# Patient Record
Sex: Female | Born: 1955
Health system: Southern US, Community
[De-identification: ages and names within clinical notes are randomized; demographics above are authoritative.]

## PROBLEM LIST (undated history)

## (undated) DIAGNOSIS — N200 Calculus of kidney: Secondary | ICD-10-CM

## (undated) DIAGNOSIS — B019 Varicella without complication: Secondary | ICD-10-CM

## (undated) DIAGNOSIS — A630 Anogenital (venereal) warts: Secondary | ICD-10-CM

## (undated) DIAGNOSIS — R519 Headache, unspecified: Secondary | ICD-10-CM

## (undated) DIAGNOSIS — M1990 Unspecified osteoarthritis, unspecified site: Secondary | ICD-10-CM

## (undated) DIAGNOSIS — K602 Anal fissure, unspecified: Secondary | ICD-10-CM

## (undated) DIAGNOSIS — E785 Hyperlipidemia, unspecified: Secondary | ICD-10-CM

## (undated) DIAGNOSIS — E669 Obesity, unspecified: Secondary | ICD-10-CM

## (undated) DIAGNOSIS — R51 Headache: Secondary | ICD-10-CM

## (undated) DIAGNOSIS — H9209 Otalgia, unspecified ear: Secondary | ICD-10-CM

## (undated) DIAGNOSIS — E7800 Pure hypercholesterolemia, unspecified: Secondary | ICD-10-CM

## (undated) DIAGNOSIS — T7840XA Allergy, unspecified, initial encounter: Secondary | ICD-10-CM

## (undated) DIAGNOSIS — G5600 Carpal tunnel syndrome, unspecified upper limb: Secondary | ICD-10-CM

## (undated) HISTORY — PX: COLONOSCOPY: SHX174

## (undated) HISTORY — DX: Headache, unspecified: R51.9

## (undated) HISTORY — DX: Anogenital (venereal) warts: A63.0

## (undated) HISTORY — DX: Allergy, unspecified, initial encounter: T78.40XA

## (undated) HISTORY — DX: Carpal tunnel syndrome, unspecified upper limb: G56.00

## (undated) HISTORY — DX: Obesity, unspecified: E66.9

## (undated) HISTORY — DX: Varicella without complication: B01.9

## (undated) HISTORY — DX: Anal fissure, unspecified: K60.2

## (undated) HISTORY — DX: Hyperlipidemia, unspecified: E78.5

## (undated) HISTORY — PX: FACIAL COSMETIC SURGERY: SHX629

## (undated) HISTORY — DX: Unspecified osteoarthritis, unspecified site: M19.90

## (undated) HISTORY — PX: CERVICAL FUSION: SHX112

## (undated) HISTORY — DX: Headache: R51

---

## 1998-08-17 ENCOUNTER — Other Ambulatory Visit: Admission: RE | Admit: 1998-08-17 | Discharge: 1998-08-17 | Payer: Self-pay | Admitting: Obstetrics and Gynecology

## 1999-04-18 ENCOUNTER — Encounter: Admission: RE | Admit: 1999-04-18 | Discharge: 1999-04-18 | Payer: Self-pay | Admitting: Obstetrics and Gynecology

## 1999-04-18 ENCOUNTER — Encounter: Payer: Self-pay | Admitting: Obstetrics and Gynecology

## 1999-08-24 ENCOUNTER — Ambulatory Visit (HOSPITAL_COMMUNITY): Admission: RE | Admit: 1999-08-24 | Discharge: 1999-08-24 | Payer: Self-pay | Admitting: Obstetrics and Gynecology

## 1999-10-03 ENCOUNTER — Other Ambulatory Visit: Admission: RE | Admit: 1999-10-03 | Discharge: 1999-10-03 | Payer: Self-pay | Admitting: Obstetrics and Gynecology

## 2000-09-25 ENCOUNTER — Other Ambulatory Visit: Admission: RE | Admit: 2000-09-25 | Discharge: 2000-09-25 | Payer: Self-pay | Admitting: Obstetrics and Gynecology

## 2000-10-15 ENCOUNTER — Encounter: Payer: Self-pay | Admitting: Obstetrics and Gynecology

## 2000-10-15 ENCOUNTER — Encounter: Admission: RE | Admit: 2000-10-15 | Discharge: 2000-10-15 | Payer: Self-pay | Admitting: Obstetrics and Gynecology

## 2001-10-16 ENCOUNTER — Encounter: Payer: Self-pay | Admitting: Obstetrics and Gynecology

## 2001-10-16 ENCOUNTER — Encounter: Admission: RE | Admit: 2001-10-16 | Discharge: 2001-10-16 | Payer: Self-pay | Admitting: Obstetrics and Gynecology

## 2002-04-01 ENCOUNTER — Emergency Department (HOSPITAL_COMMUNITY): Admission: EM | Admit: 2002-04-01 | Discharge: 2002-04-02 | Payer: Self-pay | Admitting: Emergency Medicine

## 2002-04-02 ENCOUNTER — Encounter: Payer: Self-pay | Admitting: Emergency Medicine

## 2002-04-04 ENCOUNTER — Emergency Department (HOSPITAL_COMMUNITY): Admission: EM | Admit: 2002-04-04 | Discharge: 2002-04-04 | Payer: Self-pay | Admitting: Emergency Medicine

## 2002-10-22 ENCOUNTER — Encounter: Admission: RE | Admit: 2002-10-22 | Discharge: 2002-10-22 | Payer: Self-pay | Admitting: Obstetrics and Gynecology

## 2002-10-22 ENCOUNTER — Encounter: Payer: Self-pay | Admitting: Obstetrics and Gynecology

## 2003-11-04 ENCOUNTER — Encounter: Admission: RE | Admit: 2003-11-04 | Discharge: 2003-11-04 | Payer: Self-pay | Admitting: Obstetrics and Gynecology

## 2004-11-23 ENCOUNTER — Encounter: Admission: RE | Admit: 2004-11-23 | Discharge: 2004-11-23 | Payer: Self-pay | Admitting: Obstetrics and Gynecology

## 2006-03-24 ENCOUNTER — Encounter: Admission: RE | Admit: 2006-03-24 | Discharge: 2006-03-24 | Payer: Self-pay | Admitting: Obstetrics and Gynecology

## 2007-02-04 ENCOUNTER — Emergency Department (HOSPITAL_COMMUNITY): Admission: EM | Admit: 2007-02-04 | Discharge: 2007-02-04 | Payer: Self-pay | Admitting: Emergency Medicine

## 2007-05-15 ENCOUNTER — Encounter: Admission: RE | Admit: 2007-05-15 | Discharge: 2007-05-15 | Payer: Self-pay | Admitting: Obstetrics and Gynecology

## 2008-06-24 ENCOUNTER — Encounter: Admission: RE | Admit: 2008-06-24 | Discharge: 2008-06-24 | Payer: Self-pay | Admitting: Obstetrics and Gynecology

## 2009-09-29 ENCOUNTER — Encounter: Admission: RE | Admit: 2009-09-29 | Discharge: 2009-09-29 | Payer: Self-pay | Admitting: Obstetrics and Gynecology

## 2010-07-13 NOTE — Op Note (Signed)
[facility]  Patient:    Lauren Howard, Lauren Howard                    MRN: [id number] Proc. Date: 08/24/99 Adm. Date:  [id number] Disc. Date: [id number] Attending:  Malon Kindle                           Operative Report  PREOPERATIVE DIAGNOSIS:  Voluntary sterilization.  POSTOPERATIVE DIAGNOSIS:  Voluntary sterilization.  OPERATION PERFORMED:  Laparoscopic tubal cauterization.  SURGEON:  Malachi Pro. Ambrose Mantle, M.D.  ANESTHESIA:  General.  DESCRIPTION OF PROCEDURE:  The patient was brought to the operating room and placed under satisfactory general anesthesia.  She was then placed in the knee stirrups.  The abdomen, vulva, vagina and urethra were prepped with Betadine solution.  The bladder was emptied with a [place] catheter.  A Hulka cannula was placed into the uterus and attached to the anterior cervical lip.  The abdomen was then draped as a sterile field.  A small incision was made in the inferior portion of the umbilicus.  A Veress cannula was placed into the abdominal cavity.  Aspiration revealed it was not in a blood vessel, not in bowel or urinary contents.  Elevating the abdominal wall gave a negative pressure.  2.5L of CO2 were then injected with filling pressure of ____________ mmHg.  The Veress cannula removed.  The incision was enlarged. The laparoscopic trocar was inserted followed by the laparoscope with good visualization of the pelvic contents.  The uterus was normal size without abnormality.  Both cul-de-sacs were normal.  Both tubes and ovaries appeared completely normal.  The midportion of each tube was grasped with Kleppinger forceps using a bipolar current of 4.  I continued cauterization of each tube, three or four consecutive locations from the midportion of the tube toward the uterine cornu until all the vapor had been removed and the meter read 0 and good blanching occurred.  There were no complications.  The upper  abdomen appeared normal.  The liver was smooth, the bowel appeared normal.  I did not see the gallbladder.  The lower abdominal incision was inspected for bleeding. There was no bleeding.  CO2 was allowed to escape from the peritoneal cavity. I removed the laparoscopic trocar and then very gradually removed the laparoscope so that I could inspect the umbilical incision for bleeding and there was no bleeding.  I closed the umbilical incision with several interrupted sutures of 3-0 plain catgut, closed the lower incision with Steri-Strips.  The patient seemed to tolerate the procedure well.  Blood loss was less than 5 cc.  The sponge and needle counts were correct.  The Hulka cannula was removed from the uterus and the patient was returned to recovery in satisfactory condition. DD:  09/19/99 TD:  09/21/99 Job: [id number]

## 2010-10-08 ENCOUNTER — Other Ambulatory Visit: Payer: Self-pay | Admitting: Obstetrics and Gynecology

## 2010-10-08 DIAGNOSIS — Z1231 Encounter for screening mammogram for malignant neoplasm of breast: Secondary | ICD-10-CM

## 2010-10-10 ENCOUNTER — Ambulatory Visit
Admission: RE | Admit: 2010-10-10 | Discharge: 2010-10-10 | Disposition: A | Discharge status: Home / Self Care | Payer: 59 | Source: Ambulatory Visit | Attending: Obstetrics and Gynecology | Admitting: Obstetrics and Gynecology

## 2010-10-10 DIAGNOSIS — Z1231 Encounter for screening mammogram for malignant neoplasm of breast: Secondary | ICD-10-CM

## 2011-10-22 ENCOUNTER — Other Ambulatory Visit: Payer: Self-pay | Admitting: Obstetrics and Gynecology

## 2011-10-22 DIAGNOSIS — Z1231 Encounter for screening mammogram for malignant neoplasm of breast: Secondary | ICD-10-CM

## 2011-10-25 ENCOUNTER — Ambulatory Visit
Admission: RE | Admit: 2011-10-25 | Discharge: 2011-10-25 | Disposition: A | Discharge status: Home / Self Care | Payer: 59 | Source: Ambulatory Visit | Attending: Obstetrics and Gynecology | Admitting: Obstetrics and Gynecology

## 2011-10-25 DIAGNOSIS — Z1231 Encounter for screening mammogram for malignant neoplasm of breast: Secondary | ICD-10-CM

## 2012-01-12 ENCOUNTER — Encounter (HOSPITAL_COMMUNITY): Payer: Self-pay | Admitting: Emergency Medicine

## 2012-01-12 ENCOUNTER — Emergency Department (HOSPITAL_COMMUNITY)
Admission: EM | Admit: 2012-01-12 | Discharge: 2012-01-12 | Disposition: A | Discharge status: Home / Self Care | Payer: Self-pay | Source: Home / Self Care | Attending: Emergency Medicine | Admitting: Emergency Medicine

## 2012-01-12 DIAGNOSIS — H109 Unspecified conjunctivitis: Secondary | ICD-10-CM

## 2012-01-12 MED ORDER — NAPROXEN 500 MG PO TABS: 500.0000 mg | ORAL_TABLET | Freq: Two times a day (BID) | ORAL | Status: DC

## 2012-01-12 MED ORDER — CIPROFLOXACIN HCL 0.3 % OP SOLN: OPHTHALMIC | Status: DC

## 2012-01-12 MED ORDER — TETRACAINE HCL 0.5 % OP SOLN
OPHTHALMIC | Status: AC
  Filled 2012-01-12: qty 2

## 2012-01-12 NOTE — ED Notes (Signed)
Reports left eye pain which started last night.  Reports itching.  Patient says she wears daily contacts only in the left eye.  She did not wear any today.  Reports taking contact out at night.  Denies drainage from eye but does have yellowish mucous in corner of eye.  Eye is red.  Painful when eye is rubbing against lid

## 2012-01-12 NOTE — ED Provider Notes (Signed)
Chief Complaint  Patient presents with  . Eye Pain    History of Present Illness:   Lauren Howard is a 56 year old female who has had a one-day history of left eye pain. She describes a foreign body sensation and some redness and itching. There's been a little bit of mucoid drainage. She denies any changes in her vision. She does not think she got anything in the eye. She does wear contacts in that eye but has not had any trouble with the contact. She has left it out tonight. She sees Dr. Hazle Quant as her eye doctor. She denies any diplopia or visual field defects or flashing lights.  Review of Systems:  Other than noted above, the patient denies any of the following symptoms: Systemic:  No fever, chills, sweats, fatigue, or weight loss. Eye:  No redness, eye pain, photophobia, discharge, blurred vision, or diplopia. ENT:  No nasal congestion, rhinorrhea, or sore throat. Lymphatic:  No adenopathy. Skin:  No rash or pruritis.  PMFSH:  Past medical history, family history, social history, meds, and allergies were reviewed.  Physical Exam:   Vital signs:  BP 111/74  Pulse 83  Temp 97.7 F (36.5 C) (Oral)  Resp 16  SpO2 100% General:  Alert and in no distress. Eye:  Lids and periorbital tissues are normal. There was slight injection of the conjunctiva but no discharge or drainage. No foreign body was seen in the conjunctival sac. Cornea was intact to fluorescein staining. Anterior chamber was normal. Funduscopic exam was normal. PERRLA, full EOMs. Intraocular pressure was 14 with a Tono-Pen with 5% reliability. Near vision was 20/40 with both eyes, 20/40 with the left eye alone, and 20/50 with the right eye alone. ENT:  TMs and canals clear.  Nasal mucosa normal.  No intra-oral lesions, mucous membranes moist, pharynx clear. Neck:  No adenopathy tenderness or mass. Skin:  Clear, warm and dry.  Assessment:  The encounter diagnosis was Conjunctivitis.  Plan:   1.  The following meds were prescribed:     New Prescriptions   CIPROFLOXACIN (CILOXAN) 0.3 % OPHTHALMIC SOLUTION    1 drop in left eye every 3 hours while awake   NAPROXEN (NAPROSYN) 500 MG TABLET    Take 1 tablet (500 mg total) by mouth 2 (two) times daily.   2.  The patient was instructed in symptomatic care and handouts were given. I suggested she see Dr. Hazle Quant if she's not any better in 1-2 days. 3.  The patient was told to return if becoming worse in any way, if no better in 3 or 4 days, and given some red flag symptoms that would indicate earlier return.     Reuben Likes, MD 01/12/12 2053

## 2012-07-24 DIAGNOSIS — D1720 Benign lipomatous neoplasm of skin and subcutaneous tissue of unspecified limb: Secondary | ICD-10-CM | POA: Insufficient documentation

## 2012-07-24 DIAGNOSIS — N941 Unspecified dyspareunia: Secondary | ICD-10-CM | POA: Insufficient documentation

## 2012-07-24 DIAGNOSIS — M654 Radial styloid tenosynovitis [de Quervain]: Secondary | ICD-10-CM | POA: Insufficient documentation

## 2012-07-24 DIAGNOSIS — N959 Unspecified menopausal and perimenopausal disorder: Secondary | ICD-10-CM | POA: Insufficient documentation

## 2012-07-24 DIAGNOSIS — E7800 Pure hypercholesterolemia, unspecified: Secondary | ICD-10-CM | POA: Insufficient documentation

## 2012-09-04 LAB — HM COLONOSCOPY

## 2012-11-27 ENCOUNTER — Other Ambulatory Visit: Payer: Self-pay

## 2012-11-27 DIAGNOSIS — Z1231 Encounter for screening mammogram for malignant neoplasm of breast: Secondary | ICD-10-CM

## 2012-12-23 ENCOUNTER — Ambulatory Visit
Admission: RE | Admit: 2012-12-23 | Discharge: 2012-12-23 | Disposition: A | Discharge status: Home / Self Care | Payer: PRIVATE HEALTH INSURANCE | Source: Ambulatory Visit

## 2012-12-23 DIAGNOSIS — Z1231 Encounter for screening mammogram for malignant neoplasm of breast: Secondary | ICD-10-CM

## 2013-10-05 DIAGNOSIS — M545 Low back pain, unspecified: Secondary | ICD-10-CM | POA: Insufficient documentation

## 2013-12-29 ENCOUNTER — Other Ambulatory Visit: Payer: Self-pay

## 2014-01-24 ENCOUNTER — Other Ambulatory Visit: Payer: Self-pay

## 2014-01-24 DIAGNOSIS — Z1231 Encounter for screening mammogram for malignant neoplasm of breast: Secondary | ICD-10-CM

## 2014-02-15 ENCOUNTER — Ambulatory Visit
Admission: RE | Admit: 2014-02-15 | Discharge: 2014-02-15 | Disposition: A | Discharge status: Home / Self Care | Payer: PRIVATE HEALTH INSURANCE | Source: Ambulatory Visit

## 2014-02-15 DIAGNOSIS — Z1231 Encounter for screening mammogram for malignant neoplasm of breast: Secondary | ICD-10-CM

## 2014-10-13 ENCOUNTER — Encounter (HOSPITAL_COMMUNITY): Payer: Self-pay | Admitting: Emergency Medicine

## 2014-10-13 ENCOUNTER — Emergency Department (HOSPITAL_COMMUNITY)
Admission: EM | Admit: 2014-10-13 | Discharge: 2014-10-13 | Disposition: A | Discharge status: Home / Self Care | Payer: No Typology Code available for payment source | Attending: Emergency Medicine | Admitting: Emergency Medicine

## 2014-10-13 ENCOUNTER — Emergency Department (HOSPITAL_COMMUNITY): Payer: No Typology Code available for payment source

## 2014-10-13 DIAGNOSIS — Y92410 Unspecified street and highway as the place of occurrence of the external cause: Secondary | ICD-10-CM | POA: Diagnosis not present

## 2014-10-13 DIAGNOSIS — Z8669 Personal history of other diseases of the nervous system and sense organs: Secondary | ICD-10-CM | POA: Diagnosis not present

## 2014-10-13 DIAGNOSIS — S199XXA Unspecified injury of neck, initial encounter: Secondary | ICD-10-CM | POA: Insufficient documentation

## 2014-10-13 DIAGNOSIS — S3992XA Unspecified injury of lower back, initial encounter: Secondary | ICD-10-CM | POA: Insufficient documentation

## 2014-10-13 DIAGNOSIS — Z87442 Personal history of urinary calculi: Secondary | ICD-10-CM | POA: Insufficient documentation

## 2014-10-13 DIAGNOSIS — Y998 Other external cause status: Secondary | ICD-10-CM | POA: Diagnosis not present

## 2014-10-13 DIAGNOSIS — Z8639 Personal history of other endocrine, nutritional and metabolic disease: Secondary | ICD-10-CM | POA: Diagnosis not present

## 2014-10-13 DIAGNOSIS — Y9389 Activity, other specified: Secondary | ICD-10-CM | POA: Diagnosis not present

## 2014-10-13 DIAGNOSIS — Z79899 Other long term (current) drug therapy: Secondary | ICD-10-CM | POA: Diagnosis not present

## 2014-10-13 DIAGNOSIS — S24109A Unspecified injury at unspecified level of thoracic spinal cord, initial encounter: Secondary | ICD-10-CM | POA: Diagnosis not present

## 2014-10-13 DIAGNOSIS — Z880 Allergy status to penicillin: Secondary | ICD-10-CM | POA: Insufficient documentation

## 2014-10-13 HISTORY — DX: Pure hypercholesterolemia, unspecified: E78.00

## 2014-10-13 HISTORY — DX: Calculus of kidney: N20.0

## 2014-10-13 HISTORY — DX: Otalgia, unspecified ear: H92.09

## 2014-10-13 MED ORDER — DIAZEPAM 2 MG PO TABS
2.0000 mg | ORAL_TABLET | Freq: Once | ORAL | Status: AC
  Administered 2014-10-13: 2 mg via ORAL
  Filled 2014-10-13: qty 1

## 2014-10-13 MED ORDER — NAPROXEN 500 MG PO TABS: 500.0000 mg | ORAL_TABLET | Freq: Two times a day (BID) | ORAL | Status: DC

## 2014-10-13 MED ORDER — DIAZEPAM 2 MG PO TABS: 2.0000 mg | ORAL_TABLET | Freq: Three times a day (TID) | ORAL | Status: DC

## 2014-10-13 MED ORDER — KETOROLAC TROMETHAMINE 30 MG/ML IJ SOLN
30.0000 mg | Freq: Once | INTRAMUSCULAR | Status: AC
  Administered 2014-10-13: 30 mg via INTRAMUSCULAR
  Filled 2014-10-13: qty 1

## 2014-10-13 NOTE — ED Notes (Signed)
Applied ice to pt's lower back
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
## 2014-10-17 ENCOUNTER — Encounter (HOSPITAL_COMMUNITY): Payer: Self-pay | Admitting: Vascular Surgery

## 2014-10-17 ENCOUNTER — Emergency Department (HOSPITAL_COMMUNITY)
Admission: EM | Admit: 2014-10-17 | Discharge: 2014-10-17 | Disposition: A | Discharge status: Home / Self Care | Payer: PRIVATE HEALTH INSURANCE | Attending: Emergency Medicine | Admitting: Emergency Medicine

## 2014-10-17 DIAGNOSIS — S4992XD Unspecified injury of left shoulder and upper arm, subsequent encounter: Secondary | ICD-10-CM | POA: Diagnosis not present

## 2014-10-17 DIAGNOSIS — S0990XD Unspecified injury of head, subsequent encounter: Secondary | ICD-10-CM | POA: Diagnosis not present

## 2014-10-17 DIAGNOSIS — Z79899 Other long term (current) drug therapy: Secondary | ICD-10-CM | POA: Diagnosis not present

## 2014-10-17 DIAGNOSIS — Z8639 Personal history of other endocrine, nutritional and metabolic disease: Secondary | ICD-10-CM | POA: Diagnosis not present

## 2014-10-17 DIAGNOSIS — S3992XD Unspecified injury of lower back, subsequent encounter: Secondary | ICD-10-CM | POA: Diagnosis present

## 2014-10-17 DIAGNOSIS — T148XXA Other injury of unspecified body region, initial encounter: Secondary | ICD-10-CM

## 2014-10-17 DIAGNOSIS — S199XXD Unspecified injury of neck, subsequent encounter: Secondary | ICD-10-CM | POA: Diagnosis not present

## 2014-10-17 DIAGNOSIS — Z880 Allergy status to penicillin: Secondary | ICD-10-CM | POA: Diagnosis not present

## 2014-10-17 DIAGNOSIS — S3991XD Unspecified injury of abdomen, subsequent encounter: Secondary | ICD-10-CM | POA: Diagnosis not present

## 2014-10-17 DIAGNOSIS — Z87442 Personal history of urinary calculi: Secondary | ICD-10-CM | POA: Diagnosis not present

## 2014-10-17 DIAGNOSIS — Z791 Long term (current) use of non-steroidal anti-inflammatories (NSAID): Secondary | ICD-10-CM | POA: Insufficient documentation

## 2014-10-17 LAB — CBC
HCT: 38.9 % (ref 36.0–46.0)
Hemoglobin: 13 g/dL (ref 12.0–15.0)
MCH: 30.2 pg (ref 26.0–34.0)
MCHC: 33.4 g/dL (ref 30.0–36.0)
MCV: 90.5 fL (ref 78.0–100.0)
Platelets: 266 10*3/uL (ref 150–400)
RBC: 4.3 MIL/uL (ref 3.87–5.11)
RDW: 12.9 % (ref 11.5–15.5)
WBC: 7 10*3/uL (ref 4.0–10.5)

## 2014-10-17 LAB — BASIC METABOLIC PANEL
Anion gap: 6 (ref 5–15)
BUN: 11 mg/dL (ref 6–20)
CO2: 28 mmol/L (ref 22–32)
Calcium: 9.4 mg/dL (ref 8.9–10.3)
Chloride: 106 mmol/L (ref 101–111)
Creatinine, Ser: 0.63 mg/dL (ref 0.44–1.00)
GFR calc Af Amer: >60 mL/min (ref >60)
GFR calc non Af Amer: >60 mL/min (ref >60)
Glucose, Bld: 88 mg/dL (ref 65–99)
Potassium: 4 mmol/L (ref 3.5–5.1)
Sodium: 140 mmol/L (ref 135–145)

## 2014-10-17 LAB — URINALYSIS, ROUTINE W REFLEX MICROSCOPIC
Bilirubin Urine: NEGATIVE
Glucose, UA: NEGATIVE mg/dL
Hgb urine dipstick: NEGATIVE
Ketones, ur: NEGATIVE mg/dL
Leukocytes, UA: NEGATIVE
Nitrite: NEGATIVE
Protein, ur: NEGATIVE mg/dL
Specific Gravity, Urine: 1.008 (ref 1.005–1.030)
Urobilinogen, UA: 0.2 mg/dL (ref 0.0–1.0)
pH: 7 (ref 5.0–8.0)

## 2014-10-17 MED ORDER — TRAMADOL HCL 50 MG PO TABS
50.0000 mg | ORAL_TABLET | Freq: Once | ORAL | Status: AC
  Administered 2014-10-17: 50 mg via ORAL
  Filled 2014-10-17: qty 1

## 2014-10-17 MED ORDER — NAPROXEN 500 MG PO TABS: 500.0000 mg | ORAL_TABLET | Freq: Two times a day (BID) | ORAL | Status: DC

## 2014-10-17 MED ORDER — DIAZEPAM 5 MG PO TABS
5.0000 mg | ORAL_TABLET | Freq: Once | ORAL | Status: AC
  Administered 2014-10-17: 5 mg via ORAL
  Filled 2014-10-17: qty 1

## 2014-10-17 MED ORDER — IBUPROFEN 400 MG PO TABS
600.0000 mg | ORAL_TABLET | Freq: Once | ORAL | Status: AC
  Administered 2014-10-17: 600 mg via ORAL
  Filled 2014-10-17: qty 2

## 2014-10-17 MED ORDER — TRAMADOL HCL 50 MG PO TABS: 50.0000 mg | ORAL_TABLET | Freq: Four times a day (QID) | ORAL | Status: DC | PRN

## 2014-10-17 MED ORDER — DIAZEPAM 2 MG PO TABS: 2.0000 mg | ORAL_TABLET | Freq: Two times a day (BID) | ORAL | Status: DC
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
## 2014-10-17 NOTE — ED Notes (Signed)
PT offered a warm blanket but refused.
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
## 2014-10-17 NOTE — ED Notes (Signed)
Declined W/C at D/C and was escorted to lobby by RN. 

## 2015-02-03 ENCOUNTER — Other Ambulatory Visit: Payer: Self-pay

## 2015-02-03 DIAGNOSIS — Z1231 Encounter for screening mammogram for malignant neoplasm of breast: Secondary | ICD-10-CM

## 2015-03-06 ENCOUNTER — Ambulatory Visit: Payer: PRIVATE HEALTH INSURANCE

## 2015-03-10 ENCOUNTER — Ambulatory Visit
Admission: RE | Admit: 2015-03-10 | Discharge: 2015-03-10 | Disposition: A | Discharge status: Home / Self Care | Payer: PRIVATE HEALTH INSURANCE | Source: Ambulatory Visit

## 2015-03-10 DIAGNOSIS — Z1231 Encounter for screening mammogram for malignant neoplasm of breast: Secondary | ICD-10-CM

## 2016-02-09 ENCOUNTER — Other Ambulatory Visit: Payer: Self-pay | Admitting: Family Medicine

## 2016-02-09 DIAGNOSIS — E559 Vitamin D deficiency, unspecified: Secondary | ICD-10-CM

## 2016-02-26 HISTORY — PX: HAND SURGERY: SHX662

## 2016-03-19 ENCOUNTER — Ambulatory Visit
Admission: RE | Admit: 2016-03-19 | Discharge: 2016-03-19 | Disposition: A | Discharge status: Home / Self Care | Payer: PRIVATE HEALTH INSURANCE | Source: Ambulatory Visit | Attending: Family Medicine | Admitting: Family Medicine

## 2016-03-19 DIAGNOSIS — E559 Vitamin D deficiency, unspecified: Secondary | ICD-10-CM

## 2017-01-20 DIAGNOSIS — M79644 Pain in right finger(s): Secondary | ICD-10-CM | POA: Insufficient documentation

## 2017-01-20 DIAGNOSIS — M25642 Stiffness of left hand, not elsewhere classified: Secondary | ICD-10-CM | POA: Insufficient documentation

## 2017-02-28 ENCOUNTER — Other Ambulatory Visit: Payer: Self-pay | Admitting: Family Medicine

## 2017-02-28 DIAGNOSIS — Z1231 Encounter for screening mammogram for malignant neoplasm of breast: Secondary | ICD-10-CM

## 2017-03-21 ENCOUNTER — Ambulatory Visit
Admission: RE | Admit: 2017-03-21 | Discharge: 2017-03-21 | Disposition: A | Discharge status: Home / Self Care | Payer: 59 | Source: Ambulatory Visit | Attending: Family Medicine | Admitting: Family Medicine

## 2017-03-21 DIAGNOSIS — Z1231 Encounter for screening mammogram for malignant neoplasm of breast: Secondary | ICD-10-CM | POA: Diagnosis not present

## 2017-03-25 DIAGNOSIS — M25632 Stiffness of left wrist, not elsewhere classified: Secondary | ICD-10-CM | POA: Diagnosis not present

## 2017-03-25 DIAGNOSIS — M25532 Pain in left wrist: Secondary | ICD-10-CM | POA: Diagnosis not present

## 2017-03-25 DIAGNOSIS — M25642 Stiffness of left hand, not elsewhere classified: Secondary | ICD-10-CM | POA: Diagnosis not present

## 2017-04-02 ENCOUNTER — Ambulatory Visit (HOSPITAL_COMMUNITY)
Admission: EM | Admit: 2017-04-02 | Discharge: 2017-04-02 | Disposition: A | Discharge status: Home / Self Care | Payer: 59 | Attending: Emergency Medicine | Admitting: Emergency Medicine

## 2017-04-02 ENCOUNTER — Encounter (HOSPITAL_COMMUNITY): Payer: Self-pay | Admitting: *Deleted

## 2017-04-02 DIAGNOSIS — H9202 Otalgia, left ear: Secondary | ICD-10-CM | POA: Diagnosis not present

## 2017-04-02 DIAGNOSIS — J111 Influenza due to unidentified influenza virus with other respiratory manifestations: Secondary | ICD-10-CM

## 2017-04-02 MED ORDER — HYDROCOD POLST-CPM POLST ER 10-8 MG/5ML PO SUER: 5.0000 mL | Freq: Two times a day (BID) | ORAL | 0 refills | Status: DC | PRN

## 2017-04-02 MED ORDER — FLUTICASONE PROPIONATE 50 MCG/ACT NA SUSP: 2.0000 | Freq: Every day | NASAL | 0 refills | Status: DC

## 2017-04-02 MED ORDER — IBUPROFEN 600 MG PO TABS: 600.0000 mg | ORAL_TABLET | Freq: Four times a day (QID) | ORAL | 0 refills | Status: DC | PRN

## 2017-04-02 MED ORDER — BENZONATATE 200 MG PO CAPS: 200.0000 mg | ORAL_CAPSULE | Freq: Three times a day (TID) | ORAL | 0 refills | Status: DC | PRN

## 2017-04-02 MED ORDER — OSELTAMIVIR PHOSPHATE 75 MG PO CAPS: 75.0000 mg | ORAL_CAPSULE | Freq: Two times a day (BID) | ORAL | 0 refills | Status: DC

## 2017-04-02 NOTE — ED Triage Notes (Signed)
Patient reports cough and fever since Monday. States cough is worse at night. Patient has been taking otc meds to help with fever. Patient also reports left sided earache, states that she used some numbing drops to help relieve pain.

## 2017-04-02 NOTE — Discharge Instructions (Signed)
Tamiflu, Flonase, Mucinex D, ibuprofen 600 mg to take with 1 g of Tylenol 3-4 times a day as needed for pain, fevers, Tussionex for the cough at night and Tessalon for cough during the day

## 2017-04-02 NOTE — ED Provider Notes (Signed)
HPI  SUBJECTIVE:  Lauren Howard is a 62 y.o. female who presents with the acute onset of fevers 101.8, body aches, headaches, sore throat starting 2 days ago.  She reports decreased appetite, nasal congestion, loose stools.  She reports chest soreness secondary to coughing.  No wheezing, chest pain, shortness of breath, dyspnea on exertion, rhinorrhea, postnasal drip, sinus pain or pressure.  No vomiting, abdominal pain, urinary complaints, neck stiffness, rash.  she reports a left earache starting yesterday.  Describes the pain is intermittent, throbbing, lasting several seconds and then resolving.  Is not associate with chewing, yawning.  She denies foreign body insertion, recent swimming.  No change in hearing, otorrhea.  She tried a numbing eardrop with improvement in her ear pain.  No aggravating factors.  She got a flu shot this year.  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  She is also tried cold and flu over-the-counter medicines, NyQuil, Tylenol temporary symptom improvement.  No aggravating factors.  No antibiotics in the past month.  No antipyretic in the past 6-8 hours.  Past medical history negative for otitis media, diabetes, hypertension, asthma, eczema, COPD, smoking.  She has had TMJ arthralgia.  PMD: Dr. Berlin Hun.     Past Medical History:  Diagnosis Date  . Ear pain   . Hypercholesterolemia   . Kidney stones     Past Surgical History:  Procedure Laterality Date  . FACIAL COSMETIC SURGERY      Family History  Problem Relation Age of Onset  . Hypertension Mother   . Diabetes Mother     Social History   Tobacco Use  . Smoking status: Never Smoker  . Smokeless tobacco: Never Used  Substance Use Topics  . Alcohol use: Yes    Comment: occassional wine  . Drug use: No    No current facility-administered medications for this encounter.   Current Outpatient Medications:  .  CALCIUM PO, Take 1 tablet by mouth daily., Disp:  , Rfl:  .  Multiple Vitamin (MULTIVITAMIN WITH MINERALS) TABS tablet, Take 1 tablet by mouth daily., Disp: , Rfl:  .  omega-3 acid ethyl esters (LOVAZA) 1 G capsule, Take 2 g by mouth daily., Disp: , Rfl:  .  benzonatate (TESSALON) 200 MG capsule, Take 1 capsule (200 mg total) by mouth 3 (three) times daily as needed for cough., Disp: 30 capsule, Rfl: 0 .  chlorpheniramine-HYDROcodone (TUSSIONEX PENNKINETIC ER) 10-8 MG/5ML SUER, Take 5 mLs by mouth every 12 (twelve) hours as needed for cough., Disp: 120 mL, Rfl: 0 .  fluticasone (FLONASE) 50 MCG/ACT nasal spray, Place 2 sprays into both nostrils daily., Disp: 16 g, Rfl: 0 .  ibuprofen (ADVIL,MOTRIN) 600 MG tablet, Take 1 tablet (600 mg total) by mouth every 6 (six) hours as needed., Disp: 30 tablet, Rfl: 0 .  oseltamivir (TAMIFLU) 75 MG capsule, Take 1 capsule (75 mg total) by mouth 2 (two) times daily. X 5 days, Disp: 10 capsule, Rfl: 0 .  Probiotic Product (PROBIOTIC PO), Take 1 tablet by mouth daily., Disp: , Rfl:   Allergies  Allergen Reactions  . Erythromycin     rash  . Penicillins     Rash, swelling  . Triple Antibiotic [Bacitracin-Neomycin-Polymyxin]     rash     ROS  As noted in HPI.   Physical Exam  BP 117/68 (BP Location: Right Arm)   Pulse 92   Temp 98.8 F (37.1 C) (Oral)   Resp 17  SpO2 99%   Constitutional: Well developed, well nourished, no acute distress Eyes: PERRL, EOMI, conjunctiva normal bilaterally HENT: Normocephalic, atraumatic,mucus membranes moist.  Left external ear canal normal.  No pain with traction on the pinna, palpation of tragus.  No tenderness over the mastoid.  Left external ear canal normal.  Left TM injected, retracted.  Sharp light reflex.  No tenderness, crepitus over the TMJ bilaterally clear nasal congestion, normal turbinates.  No sinus tenderness.  Normal oropharynx.  No obvious postnasal drip.  No cobblestoning. Neck: No cervical lymphadenopathy, meningismus Respiratory: Clear to  auscultation bilaterally, no rales, no wheezing, no rhonchi no chest wall tenderness Cardiovascular: Normal rate and rhythm, no murmurs, no gallops, no rubs GI: Soft, nondistended, normal bowel sounds, nontender, no rebound, no guarding Back: no CVAT skin: No rash, skin intact Musculoskeletal: No edema, no tenderness, no deformities Neurologic: Alert & oriented x 3, CN II-XII grossly intact, no motor deficits, sensation grossly intact Psychiatric: Speech and behavior appropriate   ED Course   Medications - No data to display  No orders of the defined types were placed in this encounter.  No results found for this or any previous visit (from the past 24 hour(s)). No results found.  ED Clinical Impression  Influenza  Left ear pain   ED Assessment/Plan   [place] Narcotic database reviewed for this patient, and feel that the risk/benefit ratio today is favorable for proceeding with a prescription for controlled substance.  No opiate prescriptions since November 2018.  Presentation consistent with influenza.  Feel that her otalgia is due to the nasal congestion.  It is not appear to be a classic otitis externa at this point in time.  Plan to send home with Tamiflu, Flonase, Mucinex D, ibuprofen 600 mg to take with 1 g of Tylenol 3-4 times a day as needed for pain, fevers, Tussionex and Tessalon.  Follow-up with primary care physician as needed.   Meds ordered this encounter  Medications  . fluticasone (FLONASE) 50 MCG/ACT nasal spray    Sig: Place 2 sprays into both nostrils daily.    Dispense:  16 g    Refill:  0  . ibuprofen (ADVIL,MOTRIN) 600 MG tablet    Sig: Take 1 tablet (600 mg total) by mouth every 6 (six) hours as needed.    Dispense:  30 tablet    Refill:  0  . benzonatate (TESSALON) 200 MG capsule    Sig: Take 1 capsule (200 mg total) by mouth 3 (three) times daily as needed for cough.    Dispense:  30 capsule    Refill:  0  . chlorpheniramine-HYDROcodone (TUSSIONEX  PENNKINETIC ER) 10-8 MG/5ML SUER    Sig: Take 5 mLs by mouth every 12 (twelve) hours as needed for cough.    Dispense:  120 mL    Refill:  0  . oseltamivir (TAMIFLU) 75 MG capsule    Sig: Take 1 capsule (75 mg total) by mouth 2 (two) times daily. X 5 days    Dispense:  10 capsule    Refill:  0    *This clinic note was created using [employer]. Therefore, there may be occasional mistakes despite careful proofreading.  ?   Melynda Ripple, MD 04/03/17 1402

## 2017-04-05 ENCOUNTER — Ambulatory Visit (HOSPITAL_COMMUNITY)
Admission: EM | Admit: 2017-04-05 | Discharge: 2017-04-05 | Disposition: A | Discharge status: Home / Self Care | Payer: 59 | Attending: Physician Assistant | Admitting: Physician Assistant

## 2017-04-05 ENCOUNTER — Encounter (HOSPITAL_COMMUNITY): Payer: Self-pay | Admitting: Emergency Medicine

## 2017-04-05 DIAGNOSIS — J069 Acute upper respiratory infection, unspecified: Secondary | ICD-10-CM

## 2017-04-05 DIAGNOSIS — B9789 Other viral agents as the cause of diseases classified elsewhere: Secondary | ICD-10-CM

## 2017-04-05 MED ORDER — ALBUTEROL SULFATE HFA 108 (90 BASE) MCG/ACT IN AERS: 2.0000 | INHALATION_SPRAY | Freq: Four times a day (QID) | RESPIRATORY_TRACT | 2 refills | Status: DC | PRN

## 2017-04-05 MED ORDER — PREDNISONE 5 MG (21) PO TBPK: ORAL_TABLET | ORAL | 0 refills | Status: DC

## 2017-04-05 NOTE — Discharge Instructions (Signed)
No signs of pneumonia which is great. Will add a prednisone pack for 6 days, and Albuterol inhaler. Use every 6 hours (while awake) for 2 days then as needed. I think you will improve. The cough may linger which is normal, but hopefully you will begin to slowly feel better. Continue to use the medications given by prior provider. Drink lots of water. Hope you feel better

## 2017-04-05 NOTE — ED Triage Notes (Signed)
PT C/O: dry cough .... Seen her eon 04/02/17 for flu like sx  DENIES: fevers  A&O x4... NAD... Ambulatory

## 2017-04-05 NOTE — ED Provider Notes (Signed)
[facility]    CSN: [id number] Arrival date & time: 04/05/17  1213     History   Chief Complaint Chief Complaint  Patient presents with  . Cough    HPI Lauren Howard is a 62 y.o. female.   Presents with continued cough. She was seen here on 02/06 and diagnosed with FLU. She continues to report a dry cough, fatigue and non-appetite. Her fevers have improved and she is feeling some better, but just tired and "drained". She feels that her chest is "burning" at times and is worried about pneumonia.  She has some mild sore throat. She has almost completed course of Tamiflu as well.       Past Medical History:  Diagnosis Date  . Ear pain   . Hypercholesterolemia   . Kidney stones     There are no active problems to display for this patient.   Past Surgical History:  Procedure Laterality Date  . FACIAL COSMETIC SURGERY      OB History    No data available       Home Medications    Prior to Admission medications   Medication Sig Start Date End Date Taking? Authorizing Provider  benzonatate (TESSALON) 200 MG capsule Take 1 capsule (200 mg total) by mouth 3 (three) times daily as needed for cough. 04/02/17  Yes Melynda Ripple, MD  CALCIUM PO Take 1 tablet by mouth daily.   Yes [provider]  chlorpheniramine-HYDROcodone (TUSSIONEX PENNKINETIC ER) 10-8 MG/5ML SUER Take 5 mLs by mouth every 12 (twelve) hours as needed for cough. 04/02/17  Yes Melynda Ripple, MD  fluticasone (FLONASE) 50 MCG/ACT nasal spray Place 2 sprays into both nostrils daily. 04/02/17  Yes Melynda Ripple, MD  ibuprofen (ADVIL,MOTRIN) 600 MG tablet Take 1 tablet (600 mg total) by mouth every 6 (six) hours as needed. 04/02/17  Yes Melynda Ripple, MD  Multiple Vitamin (MULTIVITAMIN WITH MINERALS) TABS tablet Take 1 tablet by mouth daily.   Yes [provider]  omega-3 acid ethyl esters (LOVAZA) 1 G capsule Take 2 g by mouth daily.   Yes [provider]    oseltamivir (TAMIFLU) 75 MG capsule Take 1 capsule (75 mg total) by mouth 2 (two) times daily. X 5 days 04/02/17  Yes Melynda Ripple, MD  Probiotic Product (PROBIOTIC PO) Take 1 tablet by mouth daily.   Yes [provider]  albuterol (PROVENTIL HFA;VENTOLIN HFA) 108 (90 Base) MCG/ACT inhaler Inhale 2 puffs into the lungs every 6 (six) hours as needed for wheezing or shortness of breath. 04/05/17   Bjorn Pippin, PA-C  predniSONE (STERAPRED UNI-PAK 21 TAB) 5 MG (21) TBPK tablet Take as directed 04/05/17   Bjorn Pippin, PA-C    Family History Family History  Problem Relation Age of Onset  . Hypertension Mother   . Diabetes Mother     Social History Social History   Tobacco Use  . Smoking status: Never Smoker  . Smokeless tobacco: Never Used  Substance Use Topics  . Alcohol use: Yes    Comment: occassional wine  . Drug use: No     Allergies   Erythromycin; Penicillins; and Triple antibiotic [bacitracin-neomycin-polymyxin]   Review of Systems Review of Systems  Constitutional: Positive for appetite change and fatigue. Negative for fever.  HENT: Positive for sore throat.   Eyes: Negative.   Respiratory: Positive for cough. Negative for shortness of breath and wheezing.   Musculoskeletal: Negative for myalgias.  Psychiatric/Behavioral: Negative.  Physical Exam Triage Vital Signs ED Triage Vitals  Enc Vitals Group     BP 04/05/17 1325 134/76     Pulse Rate 04/05/17 1325 72     Resp --      Temp 04/05/17 1325 (!) 97.5 F (36.4 C)     Temp Source 04/05/17 1325 Oral     SpO2 04/05/17 1325 98 %     Weight --      Height --      Head Circumference --      Peak Flow --      Pain Score 04/05/17 1326 5     Pain Loc --      Pain Edu? --      Excl. in [place]? --    No data found.  Updated Vital Signs BP 134/76 (BP Location: Left Arm)   Pulse 72   Temp (!) 97.5 F (36.4 C) (Oral)   SpO2 98%   Visual Acuity Right Eye Distance:   Left Eye  Distance:   Bilateral Distance:    Right Eye Near:   Left Eye Near:    Bilateral Near:     Physical Exam  Constitutional: She is oriented to person, place, and time. She appears well-developed and well-nourished. No distress.  HENT:  Head: Normocephalic and atraumatic.  Left Ear: External ear normal.  Nose: Nose normal.  Mouth/Throat: Oropharynx is clear and moist. No oropharyngeal exudate.  Neck: Normal range of motion.  Cardiovascular: Normal rate.  Pulmonary/Chest: Effort normal. She has no wheezes. She has no rales.  Rhonchi throughout bases, course BS, no crackles or frank wheeze  Lymphadenopathy:    She has no cervical adenopathy.  Neurological: She is alert and oriented to person, place, and time.  Skin: Skin is warm and dry. She is not diaphoretic.  Psychiatric: Her behavior is normal.  Nursing note and vitals reviewed.    UC Treatments / Results  Labs (all labs ordered are listed, but only abnormal results are displayed) Labs Reviewed - No data to display  EKG  EKG Interpretation None       Radiology No results found.  Procedures Procedures (including critical care time)  Medications Ordered in UC Medications - No data to display   Initial Impression / Assessment and Plan / UC Course  I have reviewed the triage vital signs and the nursing notes.  Pertinent labs & imaging results that were available during my care of the patient were reviewed by me and considered in my medical decision making (see chart for details).   Lingering cough post FLU, without indication for antibiotic therapy. No exam findings of pneumonia. Treat with MDI and low dose prednisone pack, with supportive care. FU as needed.     Final Clinical Impressions(s) / UC Diagnoses   Final diagnoses:  Viral URI with cough    ED Discharge Orders        Ordered    albuterol (PROVENTIL HFA;VENTOLIN HFA) 108 (90 Base) MCG/ACT inhaler  Every 6 hours PRN     04/05/17 1348     predniSONE (STERAPRED UNI-PAK 21 TAB) 5 MG (21) TBPK tablet     04/05/17 1348       Controlled Substance Prescriptions [place] Controlled Substance Registry consulted? Not Applicable   Bjorn Pippin, PA-C 04/05/17 1357

## 2017-09-16 DIAGNOSIS — H9201 Otalgia, right ear: Secondary | ICD-10-CM | POA: Diagnosis not present

## 2017-09-16 DIAGNOSIS — K121 Other forms of stomatitis: Secondary | ICD-10-CM | POA: Insufficient documentation

## 2017-12-17 ENCOUNTER — Encounter: Payer: Self-pay | Admitting: Primary Care

## 2017-12-17 ENCOUNTER — Ambulatory Visit: Payer: 59 | Admitting: Primary Care

## 2017-12-17 VITALS — BP 120/74 | HR 82 | Temp 98.2°F | Ht 65.0 in | Wt 145.5 lb

## 2017-12-17 DIAGNOSIS — E785 Hyperlipidemia, unspecified: Secondary | ICD-10-CM | POA: Insufficient documentation

## 2017-12-17 DIAGNOSIS — Z23 Encounter for immunization: Secondary | ICD-10-CM | POA: Diagnosis not present

## 2017-12-17 DIAGNOSIS — E7801 Familial hypercholesterolemia: Secondary | ICD-10-CM | POA: Diagnosis not present

## 2017-12-17 DIAGNOSIS — R159 Full incontinence of feces: Secondary | ICD-10-CM | POA: Diagnosis not present

## 2017-12-17 MED ORDER — ZOSTER VAC RECOMB ADJUVANTED 50 MCG/0.5ML IM SUSR: 0.5000 mL | Freq: Once | INTRAMUSCULAR | 1 refills | Status: AC

## 2017-12-17 NOTE — Patient Instructions (Signed)
Take the shingles vaccination to your pharmacy for administration.   Please schedule a physical with me anytime at your convenience. You may also schedule a lab only appointment 3-4 days prior. We will discuss your lab results in detail during your physical.  It was a pleasure to meet you today! Please don't hesitate to call or message me with any questions. Welcome to [facility]!

## 2017-12-17 NOTE — Progress Notes (Signed)
Subjective:    Patient ID: Lauren Howard, female    DOB: [birth year], 62 y.o.   MRN: [id number]  HPI  Lauren Howard is a 62 year old female who presents today to establish care and discuss the problems mentioned below. Will obtain old records. She is also requesting the shingles vaccination.   1) Anal Leakage: Patient endorses a history of sweating on the top of the gluteal cleft that she thinks may be coming from the rectum. She also reports bowel movements 3-5 times daily that are soft/liquid like, more liquid after meals. Her last colonoscopy was in 2014, due in 2024.   She saw GI in March 2018 who completed manometry and anal ultrasound which were unremarkable. She was diagnosed with pelvic floor dysfunction and was recommended to start biofeedback. She never followed through with biofeedback. She was not diagnosed with IBS.   2) Hyperlipidemia: Long history of hyperlipidemia, strong family history of this in both parents. She's never been treated with medication. She does try to work on her diet, she does not exercise.   BP Readings from Last 3 Encounters:  12/17/17 120/74  04/05/17 134/76  04/02/17 117/68    Review of Systems  Respiratory: Negative for shortness of breath.   Cardiovascular: Negative for chest pain.  Gastrointestinal:       IBS diarrhea type  Allergic/Immunologic: Positive for environmental allergies.  Neurological: Negative for headaches.       Past Medical History:  Diagnosis Date  . Allergy   . Arthritis   . Carpal tunnel syndrome   . Chickenpox   . Ear pain   . Frequent headaches   . Genital warts   . Hypercholesterolemia   . Kidney stones      Social History   Socioeconomic History  . Marital status: Married    Spouse name: Not on file  . Number of children: Not on file  . Years of education: Not on file  . Highest education level: Not on file  Occupational History  . Not on file  Social Needs  . Financial resource strain: Not on  file  . Food insecurity:    Worry: Not on file    Inability: Not on file  . Transportation needs:    Medical: Not on file    Non-medical: Not on file  Tobacco Use  . Smoking status: Never Smoker  . Smokeless tobacco: Never Used  Substance and Sexual Activity  . Alcohol use: Yes    Comment: occassional wine  . Drug use: No  . Sexual activity: Not on file  Lifestyle  . Physical activity:    Days per week: Not on file    Minutes per session: Not on file  . Stress: Not on file  Relationships  . Social connections:    Talks on phone: Not on file    Gets together: Not on file    Attends religious service: Not on file    Active member of club or organization: Not on file    Attends meetings of clubs or organizations: Not on file    Relationship status: Not on file  . Intimate partner violence:    Fear of current or ex partner: Not on file    Emotionally abused: Not on file    Physically abused: Not on file    Forced sexual activity: Not on file  Other Topics Concern  . Not on file  Social History Narrative   Married.    Past Surgical  History:  Procedure Laterality Date  . FACIAL COSMETIC SURGERY    . HAND SURGERY Left 2018    Family History  Problem Relation Age of Onset  . Hypertension Mother   . Diabetes Mother   . Depression Mother   . Arthritis Mother   . Dementia Mother   . Skin cancer Father   . Heart disease Father   . Hypertension Father   . Hyperlipidemia Father   . Dementia Father     Allergies  Allergen Reactions  . Bee Venom Anaphylaxis  . Erythromycin Nausea Only    rash rash  . Erythromycin Base Other (See Comments)  . Triple Antibiotic [Bacitracin-Neomycin-Polymyxin]     rash  . Other Rash    rash rash Triple ani-tbiotic ointment  . Penicillins Rash    Rash, swelling Other reaction(s): Other (See Comments) Rash, swelling     Current Outpatient Medications on File Prior to Visit  Medication Sig Dispense Refill  . CALCIUM PO Take  1 tablet by mouth daily.    Marland Kitchen ibuprofen (ADVIL,MOTRIN) 600 MG tablet Take 1 tablet (600 mg total) by mouth every 6 (six) hours as needed. 30 tablet 0  . Multiple Vitamin (MULTIVITAMIN WITH MINERALS) TABS tablet Take 1 tablet by mouth daily.    Marland Kitchen omega-3 acid ethyl esters (LOVAZA) 1 G capsule Take 2 g by mouth daily.     No current facility-administered medications on file prior to visit.     BP 120/74   Pulse 82   Temp 98.2 F (36.8 C) (Oral)   Ht 5\' 5"  (1.651 m)   Wt 145 lb 8 oz (66 kg)   SpO2 98%   BMI 24.21 kg/m    Objective:   Physical Exam  Constitutional: She appears well-nourished.  Neck: Neck supple.  Cardiovascular: Normal rate and regular rhythm.  Respiratory: Effort normal and breath sounds normal.  Skin: Skin is warm and dry.  Psychiatric: She has a normal mood and affect.           Assessment & Plan:

## 2017-12-17 NOTE — Assessment & Plan Note (Addendum)
Endorses potential anal leakage, diagnosed with pelvic floor disfunction per GI. Saw GI in 2018, endorses normal testing. She never followed through with recommended biofeedback for treatment. Continue to monitor. Suspect other symptoms of frequent bowel movements to be secondary to IBS.   She doesn't request any additional work up at this point.

## 2017-12-17 NOTE — Assessment & Plan Note (Signed)
Chronic for years. Strong family history in both parents. Will repeat lipids during upcoming physical.

## 2018-04-06 ENCOUNTER — Other Ambulatory Visit: Payer: Self-pay | Admitting: Primary Care

## 2018-04-06 DIAGNOSIS — E7801 Familial hypercholesterolemia: Secondary | ICD-10-CM

## 2018-04-06 DIAGNOSIS — Z1159 Encounter for screening for other viral diseases: Secondary | ICD-10-CM

## 2018-04-08 ENCOUNTER — Other Ambulatory Visit: Payer: Self-pay | Admitting: Primary Care

## 2018-04-08 DIAGNOSIS — Z1231 Encounter for screening mammogram for malignant neoplasm of breast: Secondary | ICD-10-CM

## 2018-04-09 ENCOUNTER — Other Ambulatory Visit (INDEPENDENT_AMBULATORY_CARE_PROVIDER_SITE_OTHER): Payer: 59

## 2018-04-09 DIAGNOSIS — Z1159 Encounter for screening for other viral diseases: Secondary | ICD-10-CM

## 2018-04-09 DIAGNOSIS — E7801 Familial hypercholesterolemia: Secondary | ICD-10-CM

## 2018-04-09 DIAGNOSIS — E78019 Familial hypercholesterolemia, unspecified: Secondary | ICD-10-CM

## 2018-04-09 LAB — LIPID PANEL
Cholesterol: 247 mg/dL — ABNORMAL HIGH (ref 0–200)
HDL: 53.8 mg/dL (ref >39.00)
LDL Cholesterol: 176 mg/dL — ABNORMAL HIGH (ref 0–99)
NonHDL: 192.7
Total CHOL/HDL Ratio: 5
Triglycerides: 85 mg/dL (ref 0.0–149.0)
VLDL: 17 mg/dL (ref 0.0–40.0)

## 2018-04-09 LAB — COMPREHENSIVE METABOLIC PANEL
ALT: 17 U/L (ref 0–35)
AST: 15 U/L (ref 0–37)
Albumin: 4 g/dL (ref 3.5–5.2)
Alkaline Phosphatase: 38 U/L — ABNORMAL LOW (ref 39–117)
BUN: 12 mg/dL (ref 6–23)
CO2: 30 mEq/L (ref 19–32)
Calcium: 9.1 mg/dL (ref 8.4–10.5)
Chloride: 106 mEq/L (ref 96–112)
Creatinine, Ser: 0.66 mg/dL (ref 0.40–1.20)
GFR: 90.29 mL/min (ref >60.00)
Glucose, Bld: 93 mg/dL (ref 70–99)
Potassium: 4.3 mEq/L (ref 3.5–5.1)
Sodium: 141 mEq/L (ref 135–145)
Total Bilirubin: 0.4 mg/dL (ref 0.2–1.2)
Total Protein: 7 g/dL (ref 6.0–8.3)

## 2018-04-10 LAB — HEPATITIS C ANTIBODY
Hepatitis C Ab: NONREACTIVE
SIGNAL TO CUT-OFF: 0.54 (ref <1.00)

## 2018-04-14 ENCOUNTER — Ambulatory Visit (INDEPENDENT_AMBULATORY_CARE_PROVIDER_SITE_OTHER): Payer: 59 | Admitting: Primary Care

## 2018-04-14 ENCOUNTER — Encounter: Payer: Self-pay | Admitting: Primary Care

## 2018-04-14 VITALS — BP 112/60 | HR 71 | Temp 98.1°F | Ht 65.0 in | Wt 142.2 lb

## 2018-04-14 DIAGNOSIS — R159 Full incontinence of feces: Secondary | ICD-10-CM

## 2018-04-14 DIAGNOSIS — E7801 Familial hypercholesterolemia: Secondary | ICD-10-CM

## 2018-04-14 DIAGNOSIS — Z0000 Encounter for general adult medical examination without abnormal findings: Secondary | ICD-10-CM

## 2018-04-14 NOTE — Progress Notes (Signed)
Subjective:    Patient ID: Lauren Howard, female    DOB: [birth year], 63 y.o.   MRN: [id number]  HPI  Lauren Howard is a 63 year old female who presents today for complete physical.  Immunizations: -Tetanus: Unsure, believes it's been within 5 years.  -Influenza: Completed this season  -Shingles: Never completed, does have Rx for Shingrix but would like to get it done in our clinic.  Diet: She endorses a fair diet. Breakfast: Breakfast bar, skips mostly  Lunch: Fast food, home made sandwich  Dinner: Pasta, Spinach pie, vegetables, potatoes, salads, restaurants Snacks: None  Desserts: 3-4 times weekly  Beverages: Water, soda once weekly, coffee, semi-sweet tea  Exercise: She is not exercising, does have a membership at [employer] exam: Completed in October 2019 Dental exam: Completes semi-annually  Colonoscopy: Completed in 2014, due in 2024 Pap Smear: Completed in 2018, due in 2021 Mammogram: Scheduled for March 2020 Hep C Screen: Negative in 2020  BP Readings from Last 3 Encounters:  04/14/18 112/60  12/17/17 120/74  04/05/17 134/76   Wt Readings from Last 3 Encounters:  04/14/18 142 lb 4 oz (64.5 kg)  12/17/17 145 lb 8 oz (66 kg)  10/13/14 142 lb (64.4 kg)   The 10-year ASCVD risk score Mikey Bussing DC Jr., et al., 2013) is: 4%   Values used to calculate the score:     Age: 20 years     Sex: Female     Is Non-Hispanic African American: No     Diabetic: No     Tobacco smoker: No     Systolic Blood Pressure: 053 mmHg     Is BP treated: No     HDL Cholesterol: 53.8 mg/dL     Total Cholesterol: 247 mg/dL    Review of Systems  Constitutional: Negative for unexpected weight change.  HENT: Negative for rhinorrhea.   Respiratory: Negative for cough and shortness of breath.   Cardiovascular: Negative for chest pain.  Gastrointestinal: Negative for constipation and diarrhea.  Genitourinary: Negative for difficulty urinating.  Musculoskeletal: Negative for  arthralgias and myalgias.  Skin: Negative for rash.  Allergic/Immunologic: Negative for environmental allergies.  Neurological: Negative for dizziness, numbness and headaches.  Psychiatric/Behavioral: The patient is not nervous/anxious.        Past Medical History:  Diagnosis Date  . Allergy   . Arthritis   . Carpal tunnel syndrome   . Chickenpox   . Ear pain   . Frequent headaches   . Genital warts   . Hypercholesterolemia   . Kidney stones      Social History   Socioeconomic History  . Marital status: Married    Spouse name: Not on file  . Number of children: Not on file  . Years of education: Not on file  . Highest education level: Not on file  Occupational History  . Not on file  Social Needs  . Financial resource strain: Not on file  . Food insecurity:    Worry: Not on file    Inability: Not on file  . Transportation needs:    Medical: Not on file    Non-medical: Not on file  Tobacco Use  . Smoking status: Never Smoker  . Smokeless tobacco: Never Used  Substance and Sexual Activity  . Alcohol use: Yes    Comment: occassional wine  . Drug use: No  . Sexual activity: Not on file  Lifestyle  . Physical activity:    Days per week: Not on  file    Minutes per session: Not on file  . Stress: Not on file  Relationships  . Social connections:    Talks on phone: Not on file    Gets together: Not on file    Attends religious service: Not on file    Active member of club or organization: Not on file    Attends meetings of clubs or organizations: Not on file    Relationship status: Not on file  . Intimate partner violence:    Fear of current or ex partner: Not on file    Emotionally abused: Not on file    Physically abused: Not on file    Forced sexual activity: Not on file  Other Topics Concern  . Not on file  Social History Narrative   Married.    Past Surgical History:  Procedure Laterality Date  . FACIAL COSMETIC SURGERY    . HAND SURGERY Left  2018    Family History  Problem Relation Age of Onset  . Hypertension Mother   . Diabetes Mother   . Depression Mother   . Arthritis Mother   . Dementia Mother   . Skin cancer Father   . Heart disease Father   . Hypertension Father   . Hyperlipidemia Father   . Dementia Father     Allergies  Allergen Reactions  . Bee Venom Anaphylaxis  . Erythromycin Nausea Only    rash rash  . Erythromycin Base Other (See Comments)  . Triple Antibiotic [Bacitracin-Neomycin-Polymyxin]     rash  . Other Rash    rash rash Triple ani-tbiotic ointment  . Penicillins Rash    Rash, swelling Other reaction(s): Other (See Comments) Rash, swelling     Current Outpatient Medications on File Prior to Visit  Medication Sig Dispense Refill  . CALCIUM PO Take 1 tablet by mouth daily.    Marland Kitchen ibuprofen (ADVIL,MOTRIN) 600 MG tablet Take 1 tablet (600 mg total) by mouth every 6 (six) hours as needed. 30 tablet 0  . Multiple Vitamin (MULTIVITAMIN WITH MINERALS) TABS tablet Take 1 tablet by mouth daily.    Marland Kitchen omega-3 acid ethyl esters (LOVAZA) 1 G capsule Take 2 g by mouth daily.     No current facility-administered medications on file prior to visit.     BP 112/60   Pulse 71   Temp 98.1 F (36.7 C) (Oral)   Ht 5\' 5"  (1.651 m)   Wt 142 lb 4 oz (64.5 kg)   SpO2 97%   BMI 23.67 kg/m    Objective:   Physical Exam  Constitutional: She is oriented to person, place, and time. She appears well-nourished.  HENT:  Mouth/Throat: No oropharyngeal exudate.  Eyes: Pupils are equal, round, and reactive to light. EOM are normal.  Neck: Neck supple. No thyromegaly present.  Cardiovascular: Normal rate and regular rhythm.  Respiratory: Effort normal and breath sounds normal.  GI: Soft. Bowel sounds are normal. There is no abdominal tenderness.  Musculoskeletal: Normal range of motion.  Neurological: She is alert and oriented to person, place, and time.  Skin: Skin is warm and dry.  Psychiatric: She has  a normal mood and affect.           Assessment & Plan:

## 2018-04-14 NOTE — Assessment & Plan Note (Signed)
Recent lipid panel with LDL of 176. ASCVD risk score of 4%. Strongly family history of heart disease and recommended we put her on low dose statin to prevent future complications.  She will think about this and update [place] via my chart. In the meantime she will start with regular exercise and work on diet.

## 2018-04-14 NOTE — Assessment & Plan Note (Signed)
Overall stable, is trying to watch her diet. Continue to monitor.

## 2018-04-14 NOTE — Assessment & Plan Note (Signed)
Tetanus is UTD per patient, she will update. She will be put on our Shingrix list. Flu shot UTD. Mammogram is scheduled for March 2020. Pap smear UTD. Colonoscopy UTD, due in 2024. Recommended regular exercise and healthy diet.  Exam unremarkable. Labs reviewed. Follow up in 1 year for CPE.

## 2018-04-14 NOTE — Patient Instructions (Signed)
Start exercising. You should be getting 150 minutes of moderate intensity exercise weekly.  It's important to improve your diet by reducing consumption of fast food, fried food, processed snack foods, sugary drinks. Increase consumption of fresh vegetables and fruits, whole grains, water.  Ensure you are drinking 64 ounces of water daily.  Complete your mammogram as scheduled.  Please update me once you're ready for the cholesterol medication, I do recommend it.  We will see you next year or sooner if needed. It was a pleasure to see you today!   Preventive Care 40-64 Years, Female Preventive care refers to lifestyle choices and visits with your health care provider that can promote health and wellness. What does preventive care include?   A yearly physical exam. This is also called an annual well check.  Dental exams once or twice a year.  Routine eye exams. Ask your health care provider how often you should have your eyes checked.  Personal lifestyle choices, including: ? Daily care of your teeth and gums. ? Regular physical activity. ? Eating a healthy diet. ? Avoiding tobacco and drug use. ? Limiting alcohol use. ? Practicing safe sex. ? Taking low-dose aspirin daily starting at age 66. ? Taking vitamin and mineral supplements as recommended by your health care provider. What happens during an annual well check? The services and screenings done by your health care provider during your annual well check will depend on your age, overall health, lifestyle risk factors, and family history of disease. Counseling Your health care provider may ask you questions about your:  Alcohol use.  Tobacco use.  Drug use.  Emotional well-being.  Home and relationship well-being.  Sexual activity.  Eating habits.  Work and work Statistician.  Method of birth control.  Menstrual cycle.  Pregnancy history. Screening You may have the following tests or  measurements:  Height, weight, and BMI.  Blood pressure.  Lipid and cholesterol levels. These may be checked every 5 years, or more frequently if you are over 81 years old.  Skin check.  Lung cancer screening. You may have this screening every year starting at age 24 if you have a 30-pack-year history of smoking and currently smoke or have quit within the past 15 years.  Colorectal cancer screening. All adults should have this screening starting at age 48 and continuing until age 75. Your health care provider may recommend screening at age 1. You will have tests every 1-10 years, depending on your results and the type of screening test. People at increased risk should start screening at an earlier age. Screening tests may include: ? Guaiac-based fecal occult blood testing. ? Fecal immunochemical test (FIT). ? Stool DNA test. ? Virtual colonoscopy. ? Sigmoidoscopy. During this test, a flexible tube with a tiny camera (sigmoidoscope) is used to examine your rectum and lower colon. The sigmoidoscope is inserted through your anus into your rectum and lower colon. ? Colonoscopy. During this test, a long, thin, flexible tube with a tiny camera (colonoscope) is used to examine your entire colon and rectum.  Hepatitis C blood test.  Hepatitis B blood test.  Sexually transmitted disease (STD) testing.  Diabetes screening. This is done by checking your blood sugar (glucose) after you have not eaten for a while (fasting). You may have this done every 1-3 years.  Mammogram. This may be done every 1-2 years. Talk to your health care provider about when you should start having regular mammograms. This may depend on whether you have a family  history of breast cancer.  BRCA-related cancer screening. This may be done if you have a family history of breast, ovarian, tubal, or peritoneal cancers.  Pelvic exam and Pap test. This may be done every 3 years starting at age 55. Starting at age 15, this may  be done every 5 years if you have a Pap test in combination with an HPV test.  Bone density scan. This is done to screen for osteoporosis. You may have this scan if you are at high risk for osteoporosis. Discuss your test results, treatment options, and if necessary, the need for more tests with your health care provider. Vaccines Your health care provider may recommend certain vaccines, such as:  Influenza vaccine. This is recommended every year.  Tetanus, diphtheria, and acellular pertussis (Tdap, Td) vaccine. You may need a Td booster every 10 years.  Varicella vaccine. You may need this if you have not been vaccinated.  Zoster vaccine. You may need this after age 42.  Measles, mumps, and rubella (MMR) vaccine. You may need at least one dose of MMR if you were born in 1957 or later. You may also need a second dose.  Pneumococcal 13-valent conjugate (PCV13) vaccine. You may need this if you have certain conditions and were not previously vaccinated.  Pneumococcal polysaccharide (PPSV23) vaccine. You may need one or two doses if you smoke cigarettes or if you have certain conditions.  Meningococcal vaccine. You may need this if you have certain conditions.  Hepatitis A vaccine. You may need this if you have certain conditions or if you travel or work in places where you may be exposed to hepatitis A.  Hepatitis B vaccine. You may need this if you have certain conditions or if you travel or work in places where you may be exposed to hepatitis B.  Haemophilus influenzae type b (Hib) vaccine. You may need this if you have certain conditions. Talk to your health care provider about which screenings and vaccines you need and how often you need them. This information is not intended to replace advice given to you by your health care provider. Make sure you discuss any questions you have with your health care provider. Document Released: 03/10/2015 Document Revised: 04/03/2017 Document  Reviewed: 12/13/2014 Elsevier Interactive Patient Education  2019 [employer].

## 2018-05-12 ENCOUNTER — Ambulatory Visit: Payer: 59

## 2018-05-14 ENCOUNTER — Inpatient Hospital Stay: Admission: RE | Admit: 2018-05-14 | Payer: 59 | Source: Ambulatory Visit

## 2018-06-09 ENCOUNTER — Ambulatory Visit: Payer: 59

## 2018-06-22 ENCOUNTER — Ambulatory Visit: Payer: 59

## 2018-08-03 ENCOUNTER — Ambulatory Visit
Admission: RE | Admit: 2018-08-03 | Discharge: 2018-08-03 | Disposition: A | Discharge status: Home / Self Care | Payer: 59 | Source: Ambulatory Visit | Attending: Primary Care | Admitting: Primary Care

## 2018-08-03 ENCOUNTER — Other Ambulatory Visit: Payer: Self-pay

## 2018-08-03 DIAGNOSIS — Z1231 Encounter for screening mammogram for malignant neoplasm of breast: Secondary | ICD-10-CM

## 2019-05-07 ENCOUNTER — Ambulatory Visit (INDEPENDENT_AMBULATORY_CARE_PROVIDER_SITE_OTHER)
Admission: RE | Admit: 2019-05-07 | Discharge: 2019-05-07 | Disposition: A | Discharge status: Home / Self Care | Payer: 59 | Source: Ambulatory Visit | Attending: Primary Care | Admitting: Primary Care

## 2019-05-07 ENCOUNTER — Ambulatory Visit (INDEPENDENT_AMBULATORY_CARE_PROVIDER_SITE_OTHER): Payer: 59 | Admitting: Primary Care

## 2019-05-07 ENCOUNTER — Encounter: Payer: Self-pay | Admitting: Primary Care

## 2019-05-07 ENCOUNTER — Other Ambulatory Visit: Payer: Self-pay

## 2019-05-07 VITALS — BP 112/64 | HR 76 | Temp 97.6°F | Ht 64.75 in | Wt 144.8 lb

## 2019-05-07 DIAGNOSIS — J3089 Other allergic rhinitis: Secondary | ICD-10-CM | POA: Diagnosis not present

## 2019-05-07 DIAGNOSIS — E7801 Familial hypercholesterolemia: Secondary | ICD-10-CM | POA: Diagnosis not present

## 2019-05-07 DIAGNOSIS — G8929 Other chronic pain: Secondary | ICD-10-CM | POA: Diagnosis not present

## 2019-05-07 DIAGNOSIS — N644 Mastodynia: Secondary | ICD-10-CM

## 2019-05-07 DIAGNOSIS — Z91030 Bee allergy status: Secondary | ICD-10-CM

## 2019-05-07 DIAGNOSIS — M542 Cervicalgia: Secondary | ICD-10-CM | POA: Diagnosis not present

## 2019-05-07 DIAGNOSIS — J309 Allergic rhinitis, unspecified: Secondary | ICD-10-CM | POA: Insufficient documentation

## 2019-05-07 DIAGNOSIS — Z0000 Encounter for general adult medical examination without abnormal findings: Secondary | ICD-10-CM | POA: Diagnosis not present

## 2019-05-07 MED ORDER — EPINEPHRINE 0.3 MG/0.3ML IJ SOAJ: 0.3000 mg | INTRAMUSCULAR | 0 refills | Status: AC | PRN

## 2019-05-07 MED ORDER — ROSUVASTATIN CALCIUM 10 MG PO TABS: 10.0000 mg | ORAL_TABLET | Freq: Every evening | ORAL | 3 refills | Status: DC

## 2019-05-07 NOTE — Patient Instructions (Addendum)
Complete xray(s) and labs prior to leaving today. I will notify you of your results once received.  You will be contacted regarding your referral to orthopedics.  Please let us know if you have not been contacted within two weeks.   Start rosuvastatin (Crestor) 10 mg every evening for cholesterol.  Schedule a lab appointment for 2 months to repeat the cholesterol.   Schedule nurse visits for the Shingles shots no sooner than 1 month after your last Covid vaccine.   It was a pleasure to see you today!   Preventive Care 70-79 Years Old, Female Preventive care refers to visits with your health care provider and lifestyle choices that can promote health and wellness. This includes:  A yearly physical exam. This may also be called an annual well check.  Regular dental visits and eye exams.  Immunizations.  Screening for certain conditions.  Healthy lifestyle choices, such as eating a healthy diet, getting regular exercise, not using drugs or products that contain nicotine and tobacco, and limiting alcohol use. What can I expect for my preventive care visit? Physical exam Your health care provider will check your:  Height and weight. This may be used to calculate body mass index (BMI), which tells if you are at a healthy weight.  Heart rate and blood pressure.  Skin for abnormal spots. Counseling Your health care provider may ask you questions about your:  Alcohol, tobacco, and drug use.  Emotional well-being.  Home and relationship well-being.  Sexual activity.  Eating habits.  Work and work Statistician.  Method of birth control.  Menstrual cycle.  Pregnancy history. What immunizations do I need?  Influenza (flu) vaccine  This is recommended every year. Tetanus, diphtheria, and pertussis (Tdap) vaccine  You may need a Td booster every 10 years. Varicella (chickenpox) vaccine  You may need this if you have not been vaccinated. Zoster (shingles)  vaccine  You may need this after age 29. Measles, mumps, and rubella (MMR) vaccine  You may need at least one dose of MMR if you were born in 1957 or later. You may also need a second dose. Pneumococcal conjugate (PCV13) vaccine  You may need this if you have certain conditions and were not previously vaccinated. Pneumococcal polysaccharide (PPSV23) vaccine  You may need one or two doses if you smoke cigarettes or if you have certain conditions. Meningococcal conjugate (MenACWY) vaccine  You may need this if you have certain conditions. Hepatitis A vaccine  You may need this if you have certain conditions or if you travel or work in places where you may be exposed to hepatitis A. Hepatitis B vaccine  You may need this if you have certain conditions or if you travel or work in places where you may be exposed to hepatitis B. Haemophilus influenzae type b (Hib) vaccine  You may need this if you have certain conditions. Human papillomavirus (HPV) vaccine  If recommended by your health care provider, you may need three doses over 6 months. You may receive vaccines as individual doses or as more than one vaccine together in one shot (combination vaccines). Talk with your health care provider about the risks and benefits of combination vaccines. What tests do I need? Blood tests  Lipid and cholesterol levels. These may be checked every 5 years, or more frequently if you are over 24 years old.  Hepatitis C test.  Hepatitis B test. Screening  Lung cancer screening. You may have this screening every year starting at age 24 if  you have a 30-pack-year history of smoking and currently smoke or have quit within the past 15 years.  Colorectal cancer screening. All adults should have this screening starting at age 40 and continuing until age 43. Your health care provider may recommend screening at age 3 if you are at increased risk. You will have tests every 1-10 years, depending on your  results and the type of screening test.  Diabetes screening. This is done by checking your blood sugar (glucose) after you have not eaten for a while (fasting). You may have this done every 1-3 years.  Mammogram. This may be done every 1-2 years. Talk with your health care provider about when you should start having regular mammograms. This may depend on whether you have a family history of breast cancer.  BRCA-related cancer screening. This may be done if you have a family history of breast, ovarian, tubal, or peritoneal cancers.  Pelvic exam and Pap test. This may be done every 3 years starting at age 65. Starting at age 82, this may be done every 5 years if you have a Pap test in combination with an HPV test. Other tests  Sexually transmitted disease (STD) testing.  Bone density scan. This is done to screen for osteoporosis. You may have this scan if you are at high risk for osteoporosis. Follow these instructions at home: Eating and drinking  Eat a diet that includes fresh fruits and vegetables, whole grains, lean protein, and low-fat dairy.  Take vitamin and mineral supplements as recommended by your health care provider.  Do not drink alcohol if: ? Your health care provider tells you not to drink. ? You are pregnant, may be pregnant, or are planning to become pregnant.  If you drink alcohol: ? Limit how much you have to 0-1 drink a day. ? Be aware of how much alcohol is in your drink. In the [place] one drink equals one 12 oz bottle of beer (355 mL), one 5 oz glass of wine (148 mL), or one 1 oz glass of hard liquor (44 mL). Lifestyle  Take daily care of your teeth and gums.  Stay active. Exercise for at least 30 minutes on 5 or more days each week.  Do not use any products that contain nicotine or tobacco, such as cigarettes, e-cigarettes, and chewing tobacco. If you need help quitting, ask your health care provider.  If you are sexually active, practice safe sex. Use a  condom or other form of birth control (contraception) in order to prevent pregnancy and STIs (sexually transmitted infections).  If told by your health care provider, take low-dose aspirin daily starting at age 78. What's next?  Visit your health care provider once a year for a well check visit.  Ask your health care provider how often you should have your eyes and teeth checked.  Stay up to date on all vaccines. This information is not intended to replace advice given to you by your health care provider. Make sure you discuss any questions you have with your health care provider. Document Revised: 10/23/2017 Document Reviewed: 10/23/2017 Elsevier Patient Education  2020 [employer].

## 2019-05-07 NOTE — Assessment & Plan Note (Addendum)
Immunizations UTD, except for Shingrix. She received her first covid-19 vaccine today so we will hold off on Shingrix for now.   Pap smear UTD, due in October 2021. Mammogram UTD.  Diagnostic mammogram pending. Colonoscopy UTD, due in 2024.  Encouraged regular exercise and healthy diet.  Exam today unremarkable.  Labs pending.

## 2019-05-07 NOTE — Assessment & Plan Note (Addendum)
Year round allergies, once managed on allergy injections. Discussed use of Flonase, antihistamine as she is not taking anything OTC. She will update.
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
## 2019-05-07 NOTE — Assessment & Plan Note (Signed)
Recent LDL of 176 which is about the same as February 2020. Long discussion about prevention of ASCVD, her current risk is 4.4%. Strong family history of hyperlipidemia.   We both agree that statin therapy is reasonable for prevention, especially given her family history.   Rx for Crestor 10 mg sent to pharmacy. Repeat lipids and LFT's in 2 months.

## 2019-05-08 ENCOUNTER — Other Ambulatory Visit: Payer: Self-pay | Admitting: Primary Care

## 2019-05-08 DIAGNOSIS — N644 Mastodynia: Secondary | ICD-10-CM | POA: Insufficient documentation

## 2019-05-08 DIAGNOSIS — G8929 Other chronic pain: Secondary | ICD-10-CM | POA: Insufficient documentation

## 2019-05-08 DIAGNOSIS — Z91030 Bee allergy status: Secondary | ICD-10-CM | POA: Insufficient documentation

## 2019-05-08 DIAGNOSIS — M542 Cervicalgia: Secondary | ICD-10-CM | POA: Insufficient documentation

## 2019-05-08 HISTORY — DX: Mastodynia: N64.4

## 2019-05-08 LAB — COMPREHENSIVE METABOLIC PANEL
AG Ratio: 1.4 (calc) (ref 1.0–2.5)
ALT: 22 U/L (ref 6–29)
AST: 17 U/L (ref 10–35)
Albumin: 4.3 g/dL (ref 3.6–5.1)
Alkaline phosphatase (APISO): 37 U/L (ref 37–153)
BUN: 15 mg/dL (ref 7–25)
CO2: 27 mmol/L (ref 20–32)
Calcium: 9.6 mg/dL (ref 8.6–10.4)
Chloride: 104 mmol/L (ref 98–110)
Creat: 0.66 mg/dL (ref 0.50–0.99)
Globulin: 3 g/dL (calc) (ref 1.9–3.7)
Glucose, Bld: 83 mg/dL (ref 65–99)
Potassium: 3.8 mmol/L (ref 3.5–5.3)
Sodium: 141 mmol/L (ref 135–146)
Total Bilirubin: 0.6 mg/dL (ref 0.2–1.2)
Total Protein: 7.3 g/dL (ref 6.1–8.1)

## 2019-05-08 LAB — CBC
HCT: 41 % (ref 35.0–45.0)
Hemoglobin: 13.7 g/dL (ref 11.7–15.5)
MCH: 29.8 pg (ref 27.0–33.0)
MCHC: 33.4 g/dL (ref 32.0–36.0)
MCV: 89.1 fL (ref 80.0–100.0)
MPV: 9.8 fL (ref 7.5–12.5)
Platelets: 324 10*3/uL (ref 140–400)
RBC: 4.6 10*6/uL (ref 3.80–5.10)
RDW: 12.2 % (ref 11.0–15.0)
WBC: 9.4 10*3/uL (ref 3.8–10.8)

## 2019-05-08 NOTE — Assessment & Plan Note (Signed)
Refill provided for Epi Pen.  

## 2019-05-08 NOTE — Assessment & Plan Note (Signed)
Chronic for the last 3 to 4 months, no improvement.  Tenderness on exam is noted.  Diagnostic mammogram and ultrasound pending.
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
## 2019-05-13 ENCOUNTER — Telehealth: Payer: Self-pay | Admitting: Primary Care

## 2019-05-13 NOTE — Telephone Encounter (Signed)
Pt called she has ortho appointment 4/5 and needs disc with xray from neck  Please advise when ready for pick up

## 2019-05-14 NOTE — Telephone Encounter (Signed)
Disc up front for patient to pick up, patient notified

## 2019-05-31 ENCOUNTER — Ambulatory Visit
Admission: RE | Admit: 2019-05-31 | Discharge: 2019-05-31 | Disposition: A | Discharge status: Home / Self Care | Payer: 59 | Source: Ambulatory Visit | Attending: Primary Care | Admitting: Primary Care

## 2019-05-31 ENCOUNTER — Ambulatory Visit: Payer: 59

## 2019-05-31 ENCOUNTER — Other Ambulatory Visit: Payer: Self-pay

## 2019-05-31 DIAGNOSIS — S134XXA Sprain of ligaments of cervical spine, initial encounter: Secondary | ICD-10-CM | POA: Insufficient documentation

## 2019-05-31 DIAGNOSIS — N644 Mastodynia: Secondary | ICD-10-CM

## 2019-06-25 ENCOUNTER — Other Ambulatory Visit: Payer: Self-pay | Admitting: Primary Care

## 2019-06-25 DIAGNOSIS — E7801 Familial hypercholesterolemia: Secondary | ICD-10-CM

## 2019-07-07 ENCOUNTER — Other Ambulatory Visit: Payer: 59

## 2019-07-12 ENCOUNTER — Other Ambulatory Visit: Payer: Self-pay

## 2019-07-12 ENCOUNTER — Other Ambulatory Visit (INDEPENDENT_AMBULATORY_CARE_PROVIDER_SITE_OTHER): Payer: 59

## 2019-07-12 ENCOUNTER — Other Ambulatory Visit: Payer: Self-pay | Admitting: Primary Care

## 2019-07-12 DIAGNOSIS — R5383 Other fatigue: Secondary | ICD-10-CM

## 2019-07-12 DIAGNOSIS — E7801 Familial hypercholesterolemia: Secondary | ICD-10-CM

## 2019-07-12 LAB — LIPID PANEL
Cholesterol: 171 mg/dL (ref 0–200)
HDL: 54 mg/dL (ref >39.00)
LDL Cholesterol: 90 mg/dL (ref 0–99)
NonHDL: 116.91
Total CHOL/HDL Ratio: 3
Triglycerides: 135 mg/dL (ref 0.0–149.0)
VLDL: 27 mg/dL (ref 0.0–40.0)

## 2019-07-12 LAB — HEPATIC FUNCTION PANEL
ALT: 21 U/L (ref 0–35)
AST: 19 U/L (ref 0–37)
Albumin: 4.2 g/dL (ref 3.5–5.2)
Alkaline Phosphatase: 37 U/L — ABNORMAL LOW (ref 39–117)
Bilirubin, Direct: 0.1 mg/dL (ref 0.0–0.3)
Total Bilirubin: 0.4 mg/dL (ref 0.2–1.2)
Total Protein: 6.7 g/dL (ref 6.0–8.3)

## 2019-07-12 NOTE — Addendum Note (Signed)
Addended by: Cloyd Stagers on: 07/12/2019 01:11 PM   Modules accepted: Orders

## 2019-07-13 LAB — TSH: TSH: 2.11 u[IU]/mL (ref 0.35–4.50)

## 2019-07-13 LAB — IBC + FERRITIN
Ferritin: 37.4 ng/mL (ref 10.0–291.0)
Iron: 65 ug/dL (ref 42–145)
Saturation Ratios: 18.6 % — ABNORMAL LOW (ref 20.0–50.0)
Transferrin: 249 mg/dL (ref 212.0–360.0)

## 2019-09-07 ENCOUNTER — Ambulatory Visit (INDEPENDENT_AMBULATORY_CARE_PROVIDER_SITE_OTHER): Payer: Medicare Other | Admitting: Primary Care

## 2019-09-07 ENCOUNTER — Encounter: Payer: Self-pay | Admitting: Primary Care

## 2019-09-07 ENCOUNTER — Other Ambulatory Visit: Payer: Self-pay

## 2019-09-07 VITALS — BP 118/72 | HR 80 | Temp 96.4°F | Ht 64.75 in | Wt 152.0 lb

## 2019-09-07 DIAGNOSIS — R309 Painful micturition, unspecified: Secondary | ICD-10-CM | POA: Diagnosis not present

## 2019-09-07 DIAGNOSIS — K580 Irritable bowel syndrome with diarrhea: Secondary | ICD-10-CM | POA: Diagnosis not present

## 2019-09-07 DIAGNOSIS — K589 Irritable bowel syndrome without diarrhea: Secondary | ICD-10-CM | POA: Insufficient documentation

## 2019-09-07 LAB — POC URINALSYSI DIPSTICK (AUTOMATED)
Bilirubin, UA: NEGATIVE
Blood, UA: NEGATIVE
Glucose, UA: NEGATIVE
Ketones, UA: NEGATIVE
Leukocytes, UA: NEGATIVE
Nitrite, UA: NEGATIVE
Protein, UA: NEGATIVE
Spec Grav, UA: >=1.03 — AB (ref 1.010–1.025)
Urobilinogen, UA: 0.2 E.U./dL
pH, UA: 5 (ref 5.0–8.0)

## 2019-09-07 NOTE — Assessment & Plan Note (Signed)
Chronic, prior diagnosis. No treatment. We discussed to keep a food journal for triggers. Consider dicyclomine if needed. She will update.

## 2019-09-07 NOTE — Progress Notes (Signed)
Subjective:    Patient ID: Lauren Howard, female    DOB: Aug 04, 1955, 64 y.o.   MRN: [id number]  HPI  This visit occurred during the SARS-CoV-2 public health emergency.  Safety protocols were in place, including screening questions prior to the visit, additional usage of staff PPE, and extensive cleaning of exam room while observing appropriate contact time as indicated for disinfecting solutions.   Lauren Howard is a 64 year old female who presents today for evaluation of UTI symptoms.  She was out of town during the 4th of July weekend, flew to [place]. A few days prior to her trip she developed symptoms which included urinary frequency, lower back pain, "not feeling well" had a health care service come to her hotel who completed a UA with urine culture. Her urinalysis appeared positive, but her urine culture negative. She was provided with a seven day course of an antibiotic, doesn't remember which one, completed this course. We do not have those records. Her symptoms have improved.  Today she would like to make sure that she doesn't have a UTI as she will be leaving for [place] soon.   She does mention chronic abdominal discomfort, diagnosed with IBS years ago. She has 3-6 bowel movements daily, normal for her. No acute symptoms.   Wt Readings from Last 3 Encounters:  09/07/19 152 lb (68.9 kg)  05/07/19 144 lb 12 oz (65.7 kg)  04/14/18 142 lb 4 oz (64.5 kg)    Review of Systems  Constitutional: Negative for fever.  Genitourinary: Negative for dysuria, flank pain, frequency, hematuria, pelvic pain and vaginal discharge.  Musculoskeletal: Negative for back pain.       Past Medical History:  Diagnosis Date  . Allergy   . Arthritis   . Carpal tunnel syndrome   . Chickenpox   . Ear pain   . Frequent headaches   . Genital warts   . Hypercholesterolemia   . Kidney stones      Social History   Socioeconomic History  . Marital status: Married    Spouse name: Not on  file  . Number of children: Not on file  . Years of education: Not on file  . Highest education level: Not on file  Occupational History  . Not on file  Tobacco Use  . Smoking status: Never Smoker  . Smokeless tobacco: Never Used  Substance and Sexual Activity  . Alcohol use: Yes    Comment: occassional wine  . Drug use: No  . Sexual activity: Not on file  Other Topics Concern  . Not on file  Social History Narrative   Married.   Social Determinants of Health   Financial Resource Strain:   . Difficulty of Paying Living Expenses:   Food Insecurity:   . Worried About Charity fundraiser in the Last Year:   . Arboriculturist in the Last Year:   Transportation Needs:   . Film/video editor (Medical):   Marland Kitchen Lack of Transportation (Non-Medical):   Physical Activity:   . Days of Exercise per Week:   . Minutes of Exercise per Session:   Stress:   . Feeling of Stress :   Social Connections:   . Frequency of Communication with Friends and Family:   . Frequency of Social Gatherings with Friends and Family:   . Attends Religious Services:   . Active Member of Clubs or Organizations:   . Attends Archivist Meetings:   Marland Kitchen Marital Status:  Intimate Partner Violence:   . Fear of Current or Ex-Partner:   . Emotionally Abused:   Marland Kitchen Physically Abused:   . Sexually Abused:     Past Surgical History:  Procedure Laterality Date  . FACIAL COSMETIC SURGERY    . HAND SURGERY Left 2018    Family History  Problem Relation Age of Onset  . Hypertension Mother   . Diabetes Mother   . Depression Mother   . Arthritis Mother   . Dementia Mother   . Skin cancer Father   . Heart disease Father   . Hypertension Father   . Hyperlipidemia Father   . Dementia Father     Allergies  Allergen Reactions  . Bee Venom Anaphylaxis  . Penicillins Rash    Rash, swelling Other reaction(s): Other (See Comments) Rash, swelling  Rash and swelling  . Erythromycin Nausea Only     rash rash nausea  . Erythromycin Base Other (See Comments)  . Triple Antibiotic [Bacitracin-Neomycin-Polymyxin]     rash  . Other Rash    rash rash Triple ani-tbiotic ointment    Current Outpatient Medications on File Prior to Visit  Medication Sig Dispense Refill  . aspirin 81 MG chewable tablet Chew by mouth.    Marland Kitchen CALCIUM PO Take 1 tablet by mouth daily.    . cholecalciferol (VITAMIN D3) 25 MCG (1000 UNIT) tablet     . EPINEPHrine (EPIPEN 2-PAK) 0.3 mg/0.3 mL IJ SOAJ injection Inject 0.3 mLs (0.3 mg total) into the muscle as needed for anaphylaxis. 1 each 0  . L-Lysine 500 MG TABS     . Omega-3 Fatty Acids (FISH OIL) 1200 MG CAPS     . rosuvastatin (CRESTOR) 10 MG tablet Take 1 tablet (10 mg total) by mouth every evening. For cholesterol. 90 tablet 3   No current facility-administered medications on file prior to visit.    BP 118/72   Pulse 80   Temp (!) 96.4 F (35.8 C) (Temporal)   Ht 5' 4.75" (1.645 m)   Wt 152 lb (68.9 kg)   SpO2 98%   BMI 25.49 kg/m    Objective:   Physical Exam Cardiovascular:     Rate and Rhythm: Normal rate and regular rhythm.  Pulmonary:     Effort: Pulmonary effort is normal.     Breath sounds: Normal breath sounds.  Abdominal:     General: Abdomen is flat. Bowel sounds are normal.     Tenderness: There is no abdominal tenderness. There is no right CVA tenderness or left CVA tenderness.  Musculoskeletal:     Cervical back: Neck supple.  Skin:    General: Skin is warm and dry.            Assessment & Plan:  Dysuria:  Also with frequency and lower back pain, suprapubic discomfort. Symptoms resolved. UA today negative. I asked for her to send records from her visit with Urgent Care.   Pleas Koch, NP

## 2019-09-07 NOTE — Patient Instructions (Signed)
Your urine test is negative for infection.  Ensure you are consuming 64 ounces of water daily.  It was a pleasure to see you today!

## 2019-09-07 NOTE — Addendum Note (Signed)
Addended by: Jacqualin Combes on: 09/07/2019 11:13 AM   Modules accepted: Orders

## 2019-10-19 DIAGNOSIS — M542 Cervicalgia: Secondary | ICD-10-CM | POA: Insufficient documentation

## 2019-10-27 ENCOUNTER — Other Ambulatory Visit: Payer: Self-pay | Admitting: Primary Care

## 2019-10-27 DIAGNOSIS — Z1231 Encounter for screening mammogram for malignant neoplasm of breast: Secondary | ICD-10-CM

## 2019-11-12 ENCOUNTER — Other Ambulatory Visit: Payer: Self-pay

## 2019-11-12 ENCOUNTER — Telehealth: Payer: Self-pay

## 2019-11-12 ENCOUNTER — Encounter: Payer: Self-pay | Admitting: Family Medicine

## 2019-11-12 ENCOUNTER — Ambulatory Visit (INDEPENDENT_AMBULATORY_CARE_PROVIDER_SITE_OTHER): Payer: Medicare Other | Admitting: Family Medicine

## 2019-11-12 VITALS — BP 126/70 | HR 89 | Temp 97.1°F | Ht 64.75 in | Wt 153.0 lb

## 2019-11-12 DIAGNOSIS — L709 Acne, unspecified: Secondary | ICD-10-CM | POA: Diagnosis not present

## 2019-11-12 DIAGNOSIS — B029 Zoster without complications: Secondary | ICD-10-CM

## 2019-11-12 NOTE — Telephone Encounter (Signed)
Yes ma'am. Will do! Thanks

## 2019-11-12 NOTE — Patient Instructions (Addendum)
I think the leg/buttock rash is shingles  Keep rash clean  Cortisone cream is ok if it helps  If pain worsens and tylenol does not help let me know    For the facial acne-continue to use only free and clear detergent  Consider a change to glycerin/neutrogena bar or cetaphil Avoid hot water and hard fragrances  If the salycilc or benzoyl helps-that is fine   Mask wearing plus stress may be the culprit   Take care of yourself

## 2019-11-12 NOTE — Telephone Encounter (Signed)
Yes, please change me to primary Thanks

## 2019-11-12 NOTE — Telephone Encounter (Signed)
Patient says she spoke with Dr.Tower concerning a transfer of care. Says Dr.Tower agreed to see her for annuals and major concerns. Please confirm or deny the process before patient is scheduled. Thanks

## 2019-11-12 NOTE — Progress Notes (Signed)
Subjective:    Patient ID: Lauren Howard, female    DOB: 1955/08/29, 64 y.o.   MRN: [id number]  This visit occurred during the SARS-CoV-2 public health emergency.  Safety protocols were in place, including screening questions prior to the visit, additional usage of staff PPE, and extensive cleaning of exam room while observing appropriate contact time as indicated for disinfecting solutions.    HPI 64 yo pt of Allie Bossier presents with c/o rash (2 different skin issues)  Wt Readings from Last 3 Encounters:  11/12/19 153 lb (69.4 kg)  09/07/19 152 lb (68.9 kg)  05/07/19 144 lb 12 oz (65.7 kg)   25.66 kg/m  Notes rash for a week on R upper thigh  Also breaking out on face   Started when she was in [place] helping family  She did pick up tree limbs to stack   The rash on leg is painful -? If possible shingles   Has not been vaccinated from shingles   Breaking out with pimple like lesions on face  White head  Has a pore extractor - gets clear material out  Causing some hyperpigmentation  Not itchy   She tried a salicylic acid cleanser and benzoyl peroxide spot treatment  She does wear a mask a lot   Of note-she did use a scented clothes additive  Then she re washed masks by hand w/o it -perhaps a little better Now= free and clear detergent    No new products  No change in hormones  elta md products   She has used generic retin a in the past but not recently   Used cortisone cream  Some tylenol   Patient Active Problem List   Diagnosis Date Noted  . Acne 11/12/2019  . Shingles 11/12/2019  . IBS (irritable bowel syndrome) 09/07/2019  . Chronic neck pain 05/08/2019  . Breast pain 05/08/2019  . Allergy to bee sting 05/08/2019  . Allergic rhinitis 05/07/2019  . Preventative health care 04/14/2018  . Hyperlipidemia 12/17/2017  . Fecal incontinence 12/17/2017   Past Medical History:  Diagnosis Date  . Allergy   . Arthritis   . Carpal tunnel syndrome   .  Chickenpox   . Ear pain   . Frequent headaches   . Genital warts   . Hypercholesterolemia   . Kidney stones    Past Surgical History:  Procedure Laterality Date  . FACIAL COSMETIC SURGERY    . HAND SURGERY Left 2018   Social History   Tobacco Use  . Smoking status: Never Smoker  . Smokeless tobacco: Never Used  Substance Use Topics  . Alcohol use: Yes    Comment: occassional wine  . Drug use: No   Family History  Problem Relation Age of Onset  . Hypertension Mother   . Diabetes Mother   . Depression Mother   . Arthritis Mother   . Dementia Mother   . Skin cancer Father   . Heart disease Father   . Hypertension Father   . Hyperlipidemia Father   . Dementia Father    Allergies  Allergen Reactions  . Bee Venom Anaphylaxis  . Penicillins Rash    Rash, swelling Other reaction(s): Other (See Comments) Rash, swelling  Rash and swelling  . Erythromycin Nausea Only    rash rash nausea  . Erythromycin Base Other (See Comments)  . Other Rash    rash rash Triple ani-tbiotic ointment   Current Outpatient Medications on File Prior to Visit  Medication Sig  Dispense Refill  . aspirin 81 MG chewable tablet Chew by mouth.    Marland Kitchen CALCIUM PO Take 1 tablet by mouth daily.    Marland Kitchen EPINEPHrine (EPIPEN 2-PAK) 0.3 mg/0.3 mL IJ SOAJ injection Inject 0.3 mLs (0.3 mg total) into the muscle as needed for anaphylaxis. 1 each 0  . L-Lysine 500 MG TABS     . Menthol, Topical Analgesic, (BIOFREEZE ROLL-ON) 4 % GEL Biofreeze (menthol) 4 % topical gel  Apply 1 application 4 times a day by topical route as needed for 30 days.    . Omega-3 Fatty Acids (FISH OIL) 1200 MG CAPS     . rosuvastatin (CRESTOR) 10 MG tablet Take 1 tablet (10 mg total) by mouth every evening. For cholesterol. 90 tablet 3   No current facility-administered medications on file prior to visit.     Review of Systems  Constitutional: Negative for activity change, appetite change, fatigue, fever and unexpected weight  change.  HENT: Negative for congestion, ear pain, rhinorrhea, sinus pressure and sore throat.   Eyes: Negative for pain, redness and visual disturbance.  Respiratory: Negative for cough, shortness of breath and wheezing.   Cardiovascular: Negative for chest pain and palpitations.  Gastrointestinal: Negative for abdominal pain, blood in stool, constipation and diarrhea.  Endocrine: Negative for polydipsia and polyuria.  Genitourinary: Negative for dysuria, frequency and urgency.  Musculoskeletal: Negative for arthralgias, back pain and myalgias.  Skin: Positive for rash. Negative for pallor.       Itching and pain   Also acne  Allergic/Immunologic: Negative for environmental allergies.  Neurological: Negative for dizziness, syncope and headaches.  Hematological: Negative for adenopathy. Does not bruise/bleed easily.  Psychiatric/Behavioral: Negative for decreased concentration and dysphoric mood. The patient is not nervous/anxious.        Objective:   Physical Exam Constitutional:      General: She is not in acute distress.    Appearance: Normal appearance. She is normal weight. She is not ill-appearing or diaphoretic.  Cardiovascular:     Rate and Rhythm: Normal rate and regular rhythm.  Pulmonary:     Effort: Pulmonary effort is normal. No respiratory distress.  Musculoskeletal:     Right lower leg: No edema.     Left lower leg: No edema.  Skin:    General: Skin is warm and dry.     Coloration: Skin is not jaundiced or pale.     Findings: Rash present.     Comments: Mild zoster rash on R upper post buttock  Healing erythematous papules on cheeks -no active comedones or pustules   Neurological:     Mental Status: She is alert.     Sensory: No sensory deficit.  Psychiatric:        Mood and Affect: Mood normal.           Assessment & Plan:   Problem List Items Addressed This Visit      Musculoskeletal and Integument   Acne    May be worsened by mask wearing   Enc change of cleanser to cetaphil or neutrogena glycerin  ? If possibly from rosacea or if vulgaris  Spot tx with benzoyl peroxide  Looks like already improving  inst to call if not further improved May consider topical retinoid        Other   Shingles - Primary    R upper buttock-mild outbreak  Too late for antiviral tx  inst use soap and water to clean- cortisone cream only if helpful  May consider immunization later  Declines pain medication  Disc poss of post herp neuralgia-she will keep [place] updated

## 2019-11-13 NOTE — Assessment & Plan Note (Signed)
R upper buttock-mild outbreak  Too late for antiviral tx  inst use soap and water to clean- cortisone cream only if helpful  May consider immunization later  Declines pain medication  Disc poss of post herp neuralgia-she will keep [place] updated

## 2019-11-13 NOTE — Assessment & Plan Note (Addendum)
May be worsened by mask wearing  Enc change of cleanser to cetaphil or neutrogena glycerin  ? If possibly from rosacea or if vulgaris  Spot tx with benzoyl peroxide  Looks like already improving  inst to call if not further improved May consider topical retinoid

## 2019-11-19 ENCOUNTER — Other Ambulatory Visit: Payer: Self-pay

## 2019-11-19 ENCOUNTER — Ambulatory Visit: Payer: Medicare Other

## 2019-11-19 ENCOUNTER — Ambulatory Visit
Admission: RE | Admit: 2019-11-19 | Discharge: 2019-11-19 | Disposition: A | Discharge status: Home / Self Care | Payer: Medicare Other | Source: Ambulatory Visit | Attending: Primary Care | Admitting: Primary Care

## 2019-11-19 DIAGNOSIS — Z1231 Encounter for screening mammogram for malignant neoplasm of breast: Secondary | ICD-10-CM

## 2019-11-23 ENCOUNTER — Other Ambulatory Visit: Payer: Self-pay | Admitting: Primary Care

## 2019-11-23 DIAGNOSIS — R928 Other abnormal and inconclusive findings on diagnostic imaging of breast: Secondary | ICD-10-CM

## 2019-12-02 ENCOUNTER — Ambulatory Visit
Admission: RE | Admit: 2019-12-02 | Discharge: 2019-12-02 | Disposition: A | Discharge status: Home / Self Care | Payer: Medicare Other | Source: Ambulatory Visit | Attending: Primary Care | Admitting: Primary Care

## 2019-12-02 ENCOUNTER — Other Ambulatory Visit: Payer: PRIVATE HEALTH INSURANCE

## 2019-12-02 ENCOUNTER — Other Ambulatory Visit: Payer: Self-pay

## 2019-12-02 DIAGNOSIS — R928 Other abnormal and inconclusive findings on diagnostic imaging of breast: Secondary | ICD-10-CM

## 2019-12-14 DIAGNOSIS — G8929 Other chronic pain: Secondary | ICD-10-CM

## 2019-12-14 DIAGNOSIS — R109 Unspecified abdominal pain: Secondary | ICD-10-CM

## 2019-12-29 NOTE — Telephone Encounter (Signed)
Ashtyn, Can you check on this referral?  Thanks! Anda Kraft

## 2020-01-06 ENCOUNTER — Encounter: Payer: Self-pay | Admitting: Nurse Practitioner

## 2020-01-06 ENCOUNTER — Ambulatory Visit (INDEPENDENT_AMBULATORY_CARE_PROVIDER_SITE_OTHER): Payer: Medicare Other

## 2020-01-06 DIAGNOSIS — Z23 Encounter for immunization: Secondary | ICD-10-CM | POA: Diagnosis not present

## 2020-01-06 NOTE — Progress Notes (Signed)
Per orders of Dr. Diona Browner in the absence of Alma Friendly NP, injection of tdap given right deltoid by Kem Parkinson. Patient tolerated injection well.

## 2020-01-26 NOTE — Progress Notes (Signed)
01/27/2020 Lauren Howard [id number] 06-Nov-1955   CHIEF COMPLAINT: Abdominal pain  HISTORY OF PRESENT ILLNESS:  Lauren Howard is a 64 year old female with a past medical history of arthritis, hypercholesterolemia, kidney stones, past anal fissure. Face lift, upper and lower blepheroplasty and nasal reconstruction 1993 and 2003. She presents to our office today as referred by Alma Friendly NP for further evaluation for chronic abdominal pain.  She complains of having central to lower abdominal pain for the past year.  She is also experiencing a change in bowel pattern over the past 1-1/2 years.  She previously passed a normal formed brown bowel movement daily.  However, for the past 1-1/2 years she is passing 4-7 soft to formed brown bowel movements from 3 AM to 7 AM and sometimes a bowel movement later in the day.  She has rare constipation.  Infrequent diarrhea which is sometimes triggered after eating salads.  She reports seeing a small amount of bright red blood on the tissue paper once every 3 to 4 months.  He also complains of having stomach issues for the past year.  She drinks coffee and eats a breakfast bar in the morning then feels as if the breakfast bar sits in her esophagus.  She gags, coughs and vomits out the stuck food which has occurred on 5 occasions over the past 6 weeks.  She is having acid reflux symptoms for the past few weeks as well.  She has a dry cough which is worse after eating and is also worse at night for the past 3 to 4 months.  No chest pain or shortness of breath.  No NSAID use.  She underwent an EGD 30 years ago, she does not call why this was done, no significant findings.  She underwent a colonoscopy in 2004 by Dr. Earlean Shawl and in 2014 at [facility] which she reported were normal, no colon polyps.  Family history of esophageal, gastric or colon cancer.  No fevers. No weight loss.  She has gained 10 pounds in the past year.   CBC Latest Ref Rng & Units  01/27/2020 05/07/2019 10/17/2014  WBC 4.0 - 10.5 K/uL 6.6 9.4 7.0  Hemoglobin 12.0 - 15.0 g/dL 14.0 13.7 13.0  Hematocrit 36 - 46 % 42.2 41.0 38.9  Platelets 150 - 400 K/uL 292.0 324 266    CMP Latest Ref Rng & Units 01/27/2020 07/12/2019 05/07/2019  Glucose 70 - 99 mg/dL 87 - 83  BUN 6 - 23 mg/dL 12 - 15  Creatinine 0.40 - 1.20 mg/dL 0.58 - 0.66  Sodium 135 - 145 mEq/L 139 - 141  Potassium 3.5 - 5.1 mEq/L 4.0 - 3.8  Chloride 96 - 112 mEq/L 103 - 104  CO2 19 - 32 mEq/L 29 - 27  Calcium 8.4 - 10.5 mg/dL 9.2 - 9.6  Total Protein 6.0 - 8.3 g/dL 7.4 6.7 7.3  Total Bilirubin 0.2 - 1.2 mg/dL 0.4 0.4 0.6  Alkaline Phos 39 - 117 U/L 36(L) 37(L) -  AST 0 - 37 U/L '17 19 17  ' ALT 0 - 35 U/L '18 21 22   ' Past Medical History:  Diagnosis Date  . Allergy   . Anal fissure   . Arthritis   . Carpal tunnel syndrome   . Chickenpox   . Ear pain   . Frequent headaches   . Genital warts   . Hypercholesterolemia   . Hyperlipidemia   . Kidney stones   . Obesity  Past Surgical History:  Procedure Laterality Date  . COLONOSCOPY    . FACIAL COSMETIC SURGERY    . HAND SURGERY Left 2018    Social History: Married.  She has 2 sons.  She is retired from [employer].  [place]. She drinks 10 glasses of wine monthly. No drug use.   Family History: Mother died age 3 with history of HTN, diabetes, depression and dementia. Father died age 8 with history of prostate cancer, dementia, hpertension and heart disease.   Allergies  Allergen Reactions  . Bee Venom Anaphylaxis  . Penicillins Rash    Rash, swelling Other reaction(s): Other (See Comments) Rash, swelling  Rash and swelling  . Erythromycin Nausea Only    rash rash nausea  . Erythromycin Base Other (See Comments)  . Other Rash    rash rash Triple ani-tbiotic ointment      Outpatient Encounter Medications as of 01/27/2020  Medication Sig  . Ascorbic Acid (VITAMIN C) 1000 MG tablet Take 1,000 mg by mouth daily. With rose hips  .  Cholecalciferol (VITAMIN D3) 50 MCG (2000 UT) TABS Take 1 tablet by mouth daily.  . Coenzyme Q10 (COQ10) 200 MG CAPS Take 1 capsule by mouth daily.  . Cyanocobalamin (B-12) 3000 MCG CAPS Take 1 capsule by mouth daily.  Marland Kitchen EPINEPHrine (EPIPEN 2-PAK) 0.3 mg/0.3 mL IJ SOAJ injection Inject 0.3 mLs (0.3 mg total) into the muscle as needed for anaphylaxis.  . Menthol, Topical Analgesic, (BIOFREEZE ROLL-ON) 4 % GEL Biofreeze (menthol) 4 % topical gel  Apply 1 application 4 times a day by topical route as needed for 30 days.  . Omega-3 Fatty Acids (FISH OIL) 1200 MG CAPS   . OVER THE COUNTER MEDICATION GOLI Ashwa gummies- 2-3 a day at bedtime to help sleep  . OVER THE COUNTER MEDICATION Gut connect 365- mix with water daily  . OVER THE COUNTER MEDICATION Complete collagen protein -one scoop daily  . dicyclomine (BENTYL) 10 MG capsule Take 1 capsule (10 mg total) by mouth 4 (four) times daily -  before meals and at bedtime.  . Na Sulfate-K Sulfate-Mg Sulf (SUPREP BOWEL PREP KIT) 17.5-3.13-1.6 GM/177ML SOLN Take 1 kit by mouth as directed. For colonoscopy prep  . omeprazole (PRILOSEC) 20 MG capsule Take 1 capsule (20 mg total) by mouth 2 (two) times daily before a meal.  . [DISCONTINUED] aspirin 81 MG chewable tablet Chew by mouth.  . [DISCONTINUED] CALCIUM PO Take 1 tablet by mouth daily.  . [DISCONTINUED] L-Lysine 500 MG TABS   . [DISCONTINUED] rosuvastatin (CRESTOR) 10 MG tablet Take 1 tablet (10 mg total) by mouth every evening. For cholesterol.   No facility-administered encounter medications on file as of 01/27/2020.     REVIEW OF SYSTEMS:  Gen: + Fatigue. + occasional night sweat. No weight loss.  CV: See HPI. Resp: See HPI.  No hemoptysis. GI: See HPI. GU : Denies urinary burning, blood in urine, increased urinary frequency or incontinence. MS: + arthritis, back pain. Derm: Denies rash, itchiness, skin lesions or unhealing ulcers. Psych: Denies depression, anxiety, memory loss, suicidal  ideation and confusion. Heme: Denies bruising, bleeding. Neuro: + headaches. No dizziness or paresthesias. Endo:  Denies any problems with DM, thyroid or adrenal function.    PHYSICAL EXAM: BP 120/70   Pulse 91   Ht '5\' 5"'  (1.651 m)   Wt 150 lb (68 kg)   BMI 24.96 kg/m  General: Well developed  64 year old female in no acute distress. Head: Normocephalic and  atraumatic. Eyes:  Sclerae non-icteric, conjunctive pink. Ears: Normal auditory acuity. Mouth: Dentition intact. No ulcers or lesions.  Neck: Supple, no lymphadenopathy or thyromegaly.  Lungs: Clear bilaterally to auscultation without wheezes, crackles or rhonchi. Heart: Regular rate and rhythm. No murmur, rub or gallop appreciated.  Abdomen: Soft, nontender, non distended. No masses. No hepatosplenomegaly. Normoactive bowel sounds x 4 quadrants.  Rectal: Deferred.  Musculoskeletal: Symmetrical with no gross deformities. Skin: Warm and dry. No rash or lesions on visible extremities. Extremities: No edema. Neurological: Alert oriented x 4, no focal deficits.  Psychological:  Alert and cooperative. Normal mood and affect.  ASSESSMENT AND PLAN:  88. 64 year old female with dysphagia and reflux symptoms. -EGD benefits and risks discussed including risk with sedation, risk of bleeding, perforation and infection  -Omeprazole 67m po bid  -GERD diet discussed   2.  Lower abdominal pain, chronic. Negative abdominal exam today. -CBC, CMP and CRP -Discussed scheduling an abdominal/pelvic CT scan if her abdominal pain persists and if leukocytosis is identified.  Await the above lab results. -Patient to call our office if her abdominal pain worsens -Dicyclomine 10 mg 1 p.o. 3 times daily as needed -Colonoscopy to be schedule at the time of her EGD. Colonoscopy benefits and risks discussed including risk with sedation, risk of bleeding, perforation and infection            CC:  CPleas Koch NP

## 2020-01-27 ENCOUNTER — Other Ambulatory Visit (INDEPENDENT_AMBULATORY_CARE_PROVIDER_SITE_OTHER): Payer: Medicare Other

## 2020-01-27 ENCOUNTER — Ambulatory Visit (INDEPENDENT_AMBULATORY_CARE_PROVIDER_SITE_OTHER): Payer: Medicare Other | Admitting: Nurse Practitioner

## 2020-01-27 ENCOUNTER — Encounter: Payer: Self-pay | Admitting: Nurse Practitioner

## 2020-01-27 VITALS — BP 120/70 | HR 91 | Ht 65.0 in | Wt 150.0 lb

## 2020-01-27 DIAGNOSIS — R1030 Lower abdominal pain, unspecified: Secondary | ICD-10-CM | POA: Insufficient documentation

## 2020-01-27 DIAGNOSIS — R1310 Dysphagia, unspecified: Secondary | ICD-10-CM

## 2020-01-27 DIAGNOSIS — R112 Nausea with vomiting, unspecified: Secondary | ICD-10-CM

## 2020-01-27 DIAGNOSIS — R194 Change in bowel habit: Secondary | ICD-10-CM

## 2020-01-27 DIAGNOSIS — K625 Hemorrhage of anus and rectum: Secondary | ICD-10-CM

## 2020-01-27 DIAGNOSIS — R1084 Generalized abdominal pain: Secondary | ICD-10-CM

## 2020-01-27 LAB — CBC WITH DIFFERENTIAL/PLATELET
Basophils Absolute: 0.1 10*3/uL (ref 0.0–0.1)
Basophils Relative: 1.1 % (ref 0.0–3.0)
Eosinophils Absolute: 0.1 10*3/uL (ref 0.0–0.7)
Eosinophils Relative: 0.9 % (ref 0.0–5.0)
HCT: 42.2 % (ref 36.0–46.0)
Hemoglobin: 14 g/dL (ref 12.0–15.0)
Lymphocytes Relative: 35.7 % (ref 12.0–46.0)
Lymphs Abs: 2.3 10*3/uL (ref 0.7–4.0)
MCHC: 33.1 g/dL (ref 30.0–36.0)
MCV: 88.6 fl (ref 78.0–100.0)
Monocytes Absolute: 0.5 10*3/uL (ref 0.1–1.0)
Monocytes Relative: 7.6 % (ref 3.0–12.0)
Neutro Abs: 3.6 10*3/uL (ref 1.4–7.7)
Neutrophils Relative %: 54.7 % (ref 43.0–77.0)
Platelets: 292 10*3/uL (ref 150.0–400.0)
RBC: 4.76 Mil/uL (ref 3.87–5.11)
RDW: 13.3 % (ref 11.5–15.5)
WBC: 6.6 10*3/uL (ref 4.0–10.5)

## 2020-01-27 LAB — COMPREHENSIVE METABOLIC PANEL
ALT: 18 U/L (ref 0–35)
AST: 17 U/L (ref 0–37)
Albumin: 4.3 g/dL (ref 3.5–5.2)
Alkaline Phosphatase: 36 U/L — ABNORMAL LOW (ref 39–117)
BUN: 12 mg/dL (ref 6–23)
CO2: 29 mEq/L (ref 19–32)
Calcium: 9.2 mg/dL (ref 8.4–10.5)
Chloride: 103 mEq/L (ref 96–112)
Creatinine, Ser: 0.58 mg/dL (ref 0.40–1.20)
GFR: 95 mL/min (ref >60.00)
Glucose, Bld: 87 mg/dL (ref 70–99)
Potassium: 4 mEq/L (ref 3.5–5.1)
Sodium: 139 mEq/L (ref 135–145)
Total Bilirubin: 0.4 mg/dL (ref 0.2–1.2)
Total Protein: 7.4 g/dL (ref 6.0–8.3)

## 2020-01-27 LAB — HIGH SENSITIVITY CRP: CRP, High Sensitivity: 0.6 mg/L (ref 0.000–5.000)

## 2020-01-27 MED ORDER — DICYCLOMINE HCL 10 MG PO CAPS: 10.0000 mg | ORAL_CAPSULE | Freq: Three times a day (TID) | ORAL | 0 refills | Status: DC

## 2020-01-27 MED ORDER — OMEPRAZOLE 20 MG PO CPDR: 20.0000 mg | DELAYED_RELEASE_CAPSULE | Freq: Two times a day (BID) | ORAL | 1 refills | Status: DC

## 2020-01-27 MED ORDER — SUPREP BOWEL PREP KIT 17.5-3.13-1.6 GM/177ML PO SOLN: 1.0000 | ORAL | 0 refills | Status: DC

## 2020-01-27 NOTE — Patient Instructions (Signed)
Your provider has requested that you go to the basement level for lab work before leaving today. Press "B" on the elevator. The lab is located at the first door on the left as you exit the elevator.   We have sent the following medications to your pharmacy for you to pick up at your convenience: Dicyclomine, Suprep, Omeprazole   You have been scheduled for an endoscopy and colonoscopy. Please follow the written instructions given to you at your visit today. Please pick up your prep supplies at the pharmacy within the next 1-3 days. If you use inhalers (even only as needed), please bring them with you on the day of your procedure.   Due to recent changes in healthcare laws, you may see the results of your imaging and laboratory studies on MyChart before your provider has had a chance to review them.  We understand that in some cases there may be results that are confusing or concerning to you. Not all laboratory results come back in the same time frame and the provider may be waiting for multiple results in order to interpret others.  Please give [place] 48 hours in order for your provider to thoroughly review all the results before contacting the office for clarification of your results.   Thank you for choosing me and [facility].  [place]

## 2020-01-31 ENCOUNTER — Other Ambulatory Visit: Payer: Self-pay

## 2020-01-31 DIAGNOSIS — K625 Hemorrhage of anus and rectum: Secondary | ICD-10-CM

## 2020-01-31 DIAGNOSIS — R112 Nausea with vomiting, unspecified: Secondary | ICD-10-CM

## 2020-01-31 DIAGNOSIS — R1084 Generalized abdominal pain: Secondary | ICD-10-CM

## 2020-01-31 DIAGNOSIS — K921 Melena: Secondary | ICD-10-CM

## 2020-02-03 NOTE — Progress Notes (Signed)
Agree with the assessment and plan as outlined by Colleen Kennedy-Smith, NP.   Wendelyn Kiesling, DO, FACG [facility]   

## 2020-02-04 ENCOUNTER — Telehealth: Payer: Self-pay | Admitting: *Deleted

## 2020-02-04 NOTE — Telephone Encounter (Signed)
PA submitted and approved for Omeprazole 20 mg twice daily. Faxed approval to pharmacy.

## 2020-02-07 ENCOUNTER — Telehealth: Payer: Self-pay | Admitting: Primary Care

## 2020-02-07 NOTE — Telephone Encounter (Signed)
Talked to Lauren Howard she will let me know when ready and I will call patient and let her know.

## 2020-02-07 NOTE — Telephone Encounter (Signed)
Patient is requesting a copy of her xray disk from 3/21. She had a mri done at [facility] from August 2021 . She needs it to take to her appt. Please contact patient back to discuss.

## 2020-02-09 NOTE — Telephone Encounter (Signed)
Called let patient know ready for pick up at reception.

## 2020-02-12 ENCOUNTER — Emergency Department (HOSPITAL_COMMUNITY)
Admission: EM | Admit: 2020-02-12 | Discharge: 2020-02-12 | Disposition: A | Discharge status: Home / Self Care | Payer: No Typology Code available for payment source | Attending: Emergency Medicine | Admitting: Emergency Medicine

## 2020-02-12 ENCOUNTER — Encounter (HOSPITAL_COMMUNITY): Payer: Self-pay | Admitting: Emergency Medicine

## 2020-02-12 ENCOUNTER — Emergency Department (HOSPITAL_COMMUNITY): Payer: No Typology Code available for payment source

## 2020-02-12 ENCOUNTER — Other Ambulatory Visit: Payer: Self-pay

## 2020-02-12 DIAGNOSIS — M79642 Pain in left hand: Secondary | ICD-10-CM | POA: Insufficient documentation

## 2020-02-12 DIAGNOSIS — R109 Unspecified abdominal pain: Secondary | ICD-10-CM | POA: Diagnosis not present

## 2020-02-12 DIAGNOSIS — Y92410 Unspecified street and highway as the place of occurrence of the external cause: Secondary | ICD-10-CM | POA: Diagnosis not present

## 2020-02-12 DIAGNOSIS — T148XXA Other injury of unspecified body region, initial encounter: Secondary | ICD-10-CM

## 2020-02-12 DIAGNOSIS — S39012A Strain of muscle, fascia and tendon of lower back, initial encounter: Secondary | ICD-10-CM | POA: Insufficient documentation

## 2020-02-12 DIAGNOSIS — M549 Dorsalgia, unspecified: Secondary | ICD-10-CM

## 2020-02-12 DIAGNOSIS — M542 Cervicalgia: Secondary | ICD-10-CM | POA: Insufficient documentation

## 2020-02-12 DIAGNOSIS — S3992XA Unspecified injury of lower back, initial encounter: Secondary | ICD-10-CM | POA: Diagnosis present

## 2020-02-12 MED ORDER — IOHEXOL 300 MG/ML  SOLN
100.0000 mL | Freq: Once | INTRAMUSCULAR | Status: AC | PRN
  Administered 2020-02-12: 100 mL via INTRAVENOUS

## 2020-02-12 MED ORDER — CYCLOBENZAPRINE HCL 10 MG PO TABS: 10.0000 mg | ORAL_TABLET | Freq: Three times a day (TID) | ORAL | 0 refills | Status: DC | PRN

## 2020-02-12 MED ORDER — OXYCODONE-ACETAMINOPHEN 5-325 MG PO TABS
1.0000 | ORAL_TABLET | Freq: Once | ORAL | Status: AC
  Administered 2020-02-12: 18:00:00 1 via ORAL
  Filled 2020-02-12: qty 1
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
## 2020-02-12 NOTE — ED Notes (Signed)
Dr. Roslynn Amble to lobby to assess pt for imaging needs.
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
## 2020-02-12 NOTE — Discharge Instructions (Addendum)
Please discuss the incidental finding on your right breast with your primary doctor.  Take Tylenol or Motrin for pain control.  Additionally can take the prescribed muscle relaxer as needed.  Note this can make you drowsy and should not be taken while driving or operating heavy machinery.  If you develop worsening abdominal pain, chest pain or breathing, return to ER for reassessment.

## 2020-02-14 ENCOUNTER — Telehealth: Payer: Self-pay | Admitting: Nurse Practitioner

## 2020-02-14 DIAGNOSIS — N6489 Other specified disorders of breast: Secondary | ICD-10-CM
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
## 2020-02-14 NOTE — Telephone Encounter (Signed)
Patient is advised. Her PCP has ordered a diagnostic mammogram based on the CT findings.
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
## 2020-02-15 ENCOUNTER — Other Ambulatory Visit: Payer: Self-pay

## 2020-02-15 ENCOUNTER — Other Ambulatory Visit: Payer: Self-pay | Admitting: Primary Care

## 2020-02-15 ENCOUNTER — Ambulatory Visit (HOSPITAL_COMMUNITY): Payer: Medicare Other

## 2020-02-15 DIAGNOSIS — N6489 Other specified disorders of breast: Secondary | ICD-10-CM

## 2020-02-15 NOTE — Telephone Encounter (Signed)
Noted  

## 2020-02-15 NOTE — Progress Notes (Signed)
[id number] .

## 2020-02-15 NOTE — Telephone Encounter (Signed)
Called and scheduled app for first available. 03/28/2020 at 8 am. Called patient and let her know. She will call office and see if can be placed on cancellation  list. She will also call and see if can be done at [facility]. If she needs new order will let me know.

## 2020-02-16 ENCOUNTER — Other Ambulatory Visit: Payer: Self-pay

## 2020-02-16 ENCOUNTER — Emergency Department (HOSPITAL_COMMUNITY)
Admission: EM | Admit: 2020-02-16 | Discharge: 2020-02-17 | Disposition: A | Discharge status: Home / Self Care | Payer: Medicare Other | Attending: Emergency Medicine | Admitting: Emergency Medicine

## 2020-02-16 ENCOUNTER — Encounter (HOSPITAL_COMMUNITY): Payer: Self-pay

## 2020-02-16 DIAGNOSIS — R310 Gross hematuria: Secondary | ICD-10-CM | POA: Insufficient documentation

## 2020-02-16 DIAGNOSIS — N390 Urinary tract infection, site not specified: Secondary | ICD-10-CM

## 2020-02-16 LAB — COMPREHENSIVE METABOLIC PANEL
ALT: 20 U/L (ref 0–44)
AST: 22 U/L (ref 15–41)
Albumin: 4.3 g/dL (ref 3.5–5.0)
Alkaline Phosphatase: 39 U/L (ref 38–126)
Anion gap: 10 (ref 5–15)
BUN: 13 mg/dL (ref 8–23)
CO2: 25 mmol/L (ref 22–32)
Calcium: 9.4 mg/dL (ref 8.9–10.3)
Chloride: 105 mmol/L (ref 98–111)
Creatinine, Ser: 0.64 mg/dL (ref 0.44–1.00)
GFR, Estimated: >60 mL/min (ref >60)
Glucose, Bld: 130 mg/dL — ABNORMAL HIGH (ref 70–99)
Potassium: 3.4 mmol/L — ABNORMAL LOW (ref 3.5–5.1)
Sodium: 140 mmol/L (ref 135–145)
Total Bilirubin: 0.8 mg/dL (ref 0.3–1.2)
Total Protein: 7.7 g/dL (ref 6.5–8.1)

## 2020-02-16 LAB — URINALYSIS, ROUTINE W REFLEX MICROSCOPIC

## 2020-02-16 LAB — CBC WITH DIFFERENTIAL/PLATELET
Abs Immature Granulocytes: 0.05 10*3/uL (ref 0.00–0.07)
Basophils Absolute: 0.1 10*3/uL (ref 0.0–0.1)
Basophils Relative: 0 %
Eosinophils Absolute: 0.1 10*3/uL (ref 0.0–0.5)
Eosinophils Relative: 1 %
HCT: 41.3 % (ref 36.0–46.0)
Hemoglobin: 13.5 g/dL (ref 12.0–15.0)
Immature Granulocytes: 0 %
Lymphocytes Relative: 24 %
Lymphs Abs: 3 10*3/uL (ref 0.7–4.0)
MCH: 29.9 pg (ref 26.0–34.0)
MCHC: 32.7 g/dL (ref 30.0–36.0)
MCV: 91.4 fL (ref 80.0–100.0)
Monocytes Absolute: 0.7 10*3/uL (ref 0.1–1.0)
Monocytes Relative: 6 %
Neutro Abs: 8.5 10*3/uL — ABNORMAL HIGH (ref 1.7–7.7)
Neutrophils Relative %: 69 %
Platelets: 299 10*3/uL (ref 150–400)
RBC: 4.52 MIL/uL (ref 3.87–5.11)
RDW: 12.5 % (ref 11.5–15.5)
WBC: 12.4 10*3/uL — ABNORMAL HIGH (ref 4.0–10.5)
nRBC: 0 % (ref 0.0–0.2)

## 2020-02-16 LAB — URINALYSIS, MICROSCOPIC (REFLEX): RBC / HPF: >50 RBC/hpf (ref 0–5)

## 2020-02-16 MED ORDER — SODIUM CHLORIDE 0.9 % IV BOLUS (SEPSIS)
1000.0000 mL | Freq: Once | INTRAVENOUS | Status: AC
  Administered 2020-02-17: 01:00:00 1000 mL via INTRAVENOUS

## 2020-02-16 MED ORDER — SODIUM CHLORIDE 0.9 % IV SOLN
1.0000 g | Freq: Once | INTRAVENOUS | Status: AC
  Administered 2020-02-17: 01:00:00 1 g via INTRAVENOUS
  Filled 2020-02-16: qty 10
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
## 2020-02-17 ENCOUNTER — Encounter (HOSPITAL_COMMUNITY): Payer: Self-pay

## 2020-02-17 ENCOUNTER — Emergency Department (HOSPITAL_COMMUNITY): Payer: Medicare Other

## 2020-02-17 DIAGNOSIS — R310 Gross hematuria: Secondary | ICD-10-CM | POA: Diagnosis not present

## 2020-02-17 MED ORDER — IOHEXOL 300 MG/ML  SOLN
100.0000 mL | Freq: Once | INTRAMUSCULAR | Status: AC | PRN
  Administered 2020-02-17: 100 mL via INTRAVENOUS

## 2020-02-17 MED ORDER — CEPHALEXIN 500 MG PO CAPS: 500.0000 mg | ORAL_CAPSULE | Freq: Two times a day (BID) | ORAL | 0 refills | Status: DC

## 2020-02-17 NOTE — Discharge Instructions (Signed)
You may alternate Tylenol 1000 mg every 6 hours as needed for pain, fever and Ibuprofen 800 mg every 8 hours as needed for pain, fever.  Please take Ibuprofen with food.  Do not take more than 4000 mg of Tylenol (acetaminophen) in a 24 hour period.  

## 2020-02-19 LAB — URINE CULTURE: Culture: >=100000 — AB

## 2020-02-20 ENCOUNTER — Telehealth (HOSPITAL_BASED_OUTPATIENT_CLINIC_OR_DEPARTMENT_OTHER): Payer: Self-pay | Admitting: Emergency Medicine

## 2020-02-20 NOTE — Telephone Encounter (Signed)
Post ED Visit - Positive Culture Follow-up  Culture report reviewed by antimicrobial stewardship pharmacist: [facility] Team []  Elenor Quinones, Pharm.D. []  Heide Guile, Pharm.D., BCPS AQ-ID []  Parks Neptune, Pharm.D., BCPS []  Alycia Rossetti, Pharm.D., BCPS []  Daisy, Pharm.D., BCPS, AAHIVP []  Legrand Como, Pharm.D., BCPS, AAHIVP []  Salome Arnt, PharmD, BCPS []  Johnnette Gourd, PharmD, BCPS []  Hughes Better, PharmD, BCPS []  Leeroy Cha, PharmD []  Laqueta Linden, PharmD, BCPS []  Albertina Parr, PharmD  [facility] Team []  Leodis Sias, PharmD [x]  Mercy Riding, PharmD []  Royetta Asal, PharmD []  Graylin Shiver, Rph []  Rema Fendt) Glennon Mac, PharmD []  Arlyn Dunning, PharmD []  Netta Cedars, PharmD []  Dia Sitter, PharmD []  Leone Haven, PharmD []  Gretta Arab, PharmD []  Theodis Shove, PharmD []  Peggyann Juba, PharmD []  Reuel Boom, PharmD   Positive urine culture Treated with Cephalexin, organism sensitive to the same and no further patient follow-up is required at this time.  Sandi Raveling Vanellope [place]more 02/20/2020, 6:00 PM

## 2020-03-01 ENCOUNTER — Other Ambulatory Visit: Payer: Self-pay

## 2020-03-01 ENCOUNTER — Telehealth: Payer: Self-pay | Admitting: Gastroenterology

## 2020-03-01 ENCOUNTER — Encounter: Payer: Self-pay | Admitting: Primary Care

## 2020-03-01 ENCOUNTER — Ambulatory Visit (INDEPENDENT_AMBULATORY_CARE_PROVIDER_SITE_OTHER): Payer: Medicare Other | Admitting: Primary Care

## 2020-03-01 VITALS — BP 122/64 | HR 76 | Temp 98.5°F | Ht 65.0 in | Wt 152.0 lb

## 2020-03-01 DIAGNOSIS — K580 Irritable bowel syndrome with diarrhea: Secondary | ICD-10-CM

## 2020-03-01 DIAGNOSIS — M542 Cervicalgia: Secondary | ICD-10-CM

## 2020-03-01 DIAGNOSIS — N3001 Acute cystitis with hematuria: Secondary | ICD-10-CM

## 2020-03-01 DIAGNOSIS — R350 Frequency of micturition: Secondary | ICD-10-CM

## 2020-03-01 DIAGNOSIS — F4329 Adjustment disorder with other symptoms: Secondary | ICD-10-CM | POA: Insufficient documentation

## 2020-03-01 DIAGNOSIS — N3000 Acute cystitis without hematuria: Secondary | ICD-10-CM | POA: Insufficient documentation

## 2020-03-01 LAB — POC URINALSYSI DIPSTICK (AUTOMATED)
Bilirubin, UA: NEGATIVE
Blood, UA: NEGATIVE
Glucose, UA: NEGATIVE
Ketones, UA: NEGATIVE
Leukocytes, UA: NEGATIVE
Nitrite, UA: NEGATIVE
Protein, UA: NEGATIVE
Spec Grav, UA: 1.01 (ref 1.010–1.025)
Urobilinogen, UA: 0.2 E.U./dL
pH, UA: 6.5 (ref 5.0–8.0)
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
## 2020-03-01 NOTE — Assessment & Plan Note (Signed)
Recent diagnosis on 02/17/2020 after episodes of gross hematuria with significant suprapubic pain.  Culture confirmed E. coli infection, she completed all antibiotics as prescribed.  Repeat UA today negative, will send off culture to ensure infection has resolved.

## 2020-03-01 NOTE — Telephone Encounter (Signed)
Contacted the patient regarding her prednisone rx. The patient had already decided not to take her rx until after Friday. She did not to stop any GI inflammation process.

## 2020-03-01 NOTE — Patient Instructions (Signed)
We will send off your urine for confirmation that UTI has resolved.  This will return in 2-3 days.  Complete physical therapy and follow up with Dr. Earley Favor as planned.  Please update me if you need anything, or if anything changes.  It was a pleasure to see you today!

## 2020-03-01 NOTE — Assessment & Plan Note (Signed)
Following with GI, will undergo endoscopy and colonoscopy next week.  Continue dicyclomine as needed, Zofran as needed, daily PPI
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
## 2020-03-02 ENCOUNTER — Other Ambulatory Visit: Payer: Self-pay | Admitting: Nurse Practitioner

## 2020-03-02 LAB — URINE CULTURE
MICRO NUMBER:: 11385055
Result:: NO GROWTH
SPECIMEN QUALITY:: ADEQUATE

## 2020-03-03 ENCOUNTER — Encounter: Payer: Self-pay | Admitting: Gastroenterology

## 2020-03-03 ENCOUNTER — Ambulatory Visit (AMBULATORY_SURGERY_CENTER): Payer: Medicare Other | Admitting: Gastroenterology

## 2020-03-03 ENCOUNTER — Other Ambulatory Visit: Payer: Self-pay

## 2020-03-03 VITALS — BP 131/76 | HR 65 | Temp 97.7°F | Resp 12 | Ht 65.0 in | Wt 150.0 lb

## 2020-03-03 DIAGNOSIS — K621 Rectal polyp: Secondary | ICD-10-CM | POA: Diagnosis not present

## 2020-03-03 DIAGNOSIS — K625 Hemorrhage of anus and rectum: Secondary | ICD-10-CM | POA: Diagnosis not present

## 2020-03-03 DIAGNOSIS — K648 Other hemorrhoids: Secondary | ICD-10-CM | POA: Diagnosis not present

## 2020-03-03 DIAGNOSIS — R1310 Dysphagia, unspecified: Secondary | ICD-10-CM

## 2020-03-03 DIAGNOSIS — K2970 Gastritis, unspecified, without bleeding: Secondary | ICD-10-CM

## 2020-03-03 DIAGNOSIS — K319 Disease of stomach and duodenum, unspecified: Secondary | ICD-10-CM

## 2020-03-03 DIAGNOSIS — K2980 Duodenitis without bleeding: Secondary | ICD-10-CM

## 2020-03-03 DIAGNOSIS — D128 Benign neoplasm of rectum: Secondary | ICD-10-CM

## 2020-03-03 DIAGNOSIS — R1084 Generalized abdominal pain: Secondary | ICD-10-CM

## 2020-03-03 DIAGNOSIS — R194 Change in bowel habit: Secondary | ICD-10-CM

## 2020-03-03 MED ORDER — SODIUM CHLORIDE 0.9 % IV SOLN: 500.0000 mL | Freq: Once | INTRAVENOUS | Status: AC

## 2020-03-03 NOTE — Op Note (Signed)
[facility] Patient Name: Tyshay Capanna Procedure Date: 03/03/2020 11:41 AM MRN: [id number] Endoscopist: Gerrit Heck , MD Age: 65 Referring MD:  Date of Birth: 02/13/1956 Gender: Female Account #: [id number] Procedure:                Upper GI endoscopy Indications:              Generalized abdominal pain, Dysphagia, Suspected                            esophageal reflux, Chronic cough, Diarrhea Medicines:                Monitored Anesthesia Care Procedure:                Pre-Anesthesia Assessment:                           - Prior to the procedure, a History and Physical                            was performed, and patient medications and                            allergies were reviewed. The patient's tolerance of                            previous anesthesia was also reviewed. The risks                            and benefits of the procedure and the sedation                            options and risks were discussed with the patient.                            All questions were answered, and informed consent                            was obtained. Prior Anticoagulants: The patient has                            taken no previous anticoagulant or antiplatelet                            agents. ASA Grade Assessment: II - A patient with                            mild systemic disease. After reviewing the risks                            and benefits, the patient was deemed in                            satisfactory condition to undergo the procedure.  After obtaining informed consent, the endoscope was                            passed under direct vision. Throughout the                            procedure, the patient's blood pressure, pulse, and                            oxygen saturations were monitored continuously. The                            Endoscope was introduced through the mouth, and                            advanced to  the second part of duodenum. The upper                            GI endoscopy was accomplished without difficulty.                            The patient tolerated the procedure well. Scope In: Scope Out: Findings:                 The examined esophagus was normal. The scope was                            withdrawn. Based on symptomatology, the decision                            was made to perform empiric esophageal dilation.                            Dilation was performed with a Maloney dilator with                            no resistance at 77 Fr. The dilation site was                            examined following endoscope reinsertion and showed                            no bleeding, mucosal tear or perforation. Estimated                            blood loss: none.                           The Z-line was regular and was found 40 cm from the                            incisors.                           Localized  minimal inflammation characterized by                            erythema was found in the gastric antrum. Biopsies                            were taken with a cold forceps for Helicobacter                            pylori testing. Estimated blood loss was minimal.                           The gastric fundus, gastric body and incisura were                            normal.                           The examined duodenum was normal. Biopsies for                            histology were taken with a cold forceps for                            evaluation of celiac disease. Estimated blood loss                            was minimal. Complications:            No immediate complications. Estimated Blood Loss:     Estimated blood loss was minimal. Impression:               - Normal esophagus. Dilated.                           - Z-line regular, 40 cm from the incisors.                           - Gastritis. Biopsied.                           - Normal gastric fundus,  gastric body and incisura.                           - Normal examined duodenum. Biopsied. Recommendation:           - Patient has a contact number available for                            emergencies. The signs and symptoms of potential                            delayed complications were discussed with the                            patient. Return to normal activities tomorrow.  Written discharge instructions were provided to the                            patient.                           - Resume previous diet.                           - Continue present medications.                           - Await pathology results.                           - Perform a colonoscopy today. Gerrit Heck, MD 03/03/2020 12:18:47 PM

## 2020-03-03 NOTE — Progress Notes (Signed)
History reviewed today  VS SM

## 2020-03-03 NOTE — Progress Notes (Signed)
Called to room to assist during endoscopic procedure.  Patient ID and intended procedure confirmed with present staff. Received instructions for my participation in the procedure from the performing physician.  

## 2020-03-03 NOTE — Op Note (Signed)
[facility] Patient Name: Lauren Howard Procedure Date: 03/03/2020 11:40 AM MRN: [id number] Endoscopist: Gerrit Heck , MD Age: 65 Referring MD:  Date of Birth: 1955/04/21 Gender: Female Account #: [id number] Procedure:                Colonoscopy Indications:              Generalized abdominal pain, Hematochezia, Change in                            bowel habits, Diarrhea Medicines:                Monitored Anesthesia Care Procedure:                Pre-Anesthesia Assessment:                           - Prior to the procedure, a History and Physical                            was performed, and patient medications and                            allergies were reviewed. The patient's tolerance of                            previous anesthesia was also reviewed. The risks                            and benefits of the procedure and the sedation                            options and risks were discussed with the patient.                            All questions were answered, and informed consent                            was obtained. Prior Anticoagulants: The patient has                            taken no previous anticoagulant or antiplatelet                            agents. ASA Grade Assessment: II - A patient with                            mild systemic disease. After reviewing the risks                            and benefits, the patient was deemed in                            satisfactory condition to undergo the procedure.  After obtaining informed consent, the colonoscope                            was passed under direct vision. Throughout the                            procedure, the patient's blood pressure, pulse, and                            oxygen saturations were monitored continuously. The                            Olympus CF-HQ190L [id number]) Colonoscope was                            introduced through the anus and  advanced to the 7                            cm into the ileum. The colonoscopy was performed                            without difficulty. The patient tolerated the                            procedure well. The quality of the bowel                            preparation was excellent. The terminal ileum,                            ileocecal valve, appendiceal orifice, and rectum                            were photographed. Scope In: 11:55:14 AM Scope Out: 12:13:07 PM Scope Withdrawal Time: 0 hours 14 minutes 54 seconds  Total Procedure Duration: 0 hours 17 minutes 53 seconds  Findings:                 Hemorrhoids were found on perianal exam.                           A 2 mm polyp was found in the rectum. The polyp was                            sessile. The polyp was removed with a cold biopsy                            forceps. Resection and retrieval were complete.                           Localized moderate inflammation characterized by                            congestion (edema), erythema and a single shallow  ulceration was found at the ileocecal valve. This                            was limited to the ICV only. Biopsies were taken                            with a cold forceps for histology. Estimated blood                            loss was minimal.                           Normal mucosa was otherwise found in the entire                            remainder of the colon. Biopsies for histology were                            taken with a cold forceps from the right colon and                            left colon for evaluation of microscopic colitis.                            Estimated blood loss was minimal.                           The terminal ileum otherwise appeared normal.                           Non-bleeding internal hemorrhoids were found during                            retroflexion. The hemorrhoids were small. Complications:             No immediate complications. Estimated Blood Loss:     Estimated blood loss was minimal. Impression:               - Hemorrhoids found on perianal exam.                           - One 2 mm polyp in the rectum, removed with a cold                            biopsy forceps. Resected and retrieved.                           - Localized moderate inflammation was found at the                            ileocecal valve. Biopsied.                           - Normal mucosa in the entire examined colon.  Biopsied.                           - The examined portion of the ileum was normal.                           - Non-bleeding internal hemorrhoids. Recommendation:           - Patient has a contact number available for                            emergencies. The signs and symptoms of potential                            delayed complications were discussed with the                            patient. Return to normal activities tomorrow.                            Written discharge instructions were provided to the                            patient.                           - Resume previous diet.                           - Continue present medications.                           - Await pathology results.                           - Repeat colonoscopy for surveillance based on                            pathology results.                           - Depending on the biopsy results, will consider                            trial of budesonide and further small bowel imaging                            (ie, CTE or MRE).                           - Return to GI clinic at appointment to be                            scheduled. Gerrit Heck, MD 03/03/2020 12:26:29 PM

## 2020-03-03 NOTE — Patient Instructions (Signed)
Follow Dilation Diet, Continue previous medications. Await pathology results.  YOU HAD AN ENDOSCOPIC PROCEDURE TODAY AT [facility]:   Refer to the procedure report that was given to you for any specific questions about what was found during the examination.  If the procedure report does not answer your questions, please call your gastroenterologist to clarify.  If you requested that your care partner not be given the details of your procedure findings, then the procedure report has been included in a sealed envelope for you to review at your convenience later.  YOU SHOULD EXPECT: Some feelings of bloating in the abdomen. Passage of more gas than usual.  Walking can help get rid of the air that was put into your GI tract during the procedure and reduce the bloating. If you had a lower endoscopy (such as a colonoscopy or flexible sigmoidoscopy) you may notice spotting of blood in your stool or on the toilet paper. If you underwent a bowel prep for your procedure, you may not have a normal bowel movement for a few days.  Please Note:  You might notice some irritation and congestion in your nose or some drainage.  This is from the oxygen used during your procedure.  There is no need for concern and it should clear up in a day or so.  SYMPTOMS TO REPORT IMMEDIATELY:   Following lower endoscopy (colonoscopy or flexible sigmoidoscopy):  Excessive amounts of blood in the stool  Significant tenderness or worsening of abdominal pains  Swelling of the abdomen that is new, acute  Fever of 100F or higher   Following upper endoscopy (EGD)  Vomiting of blood or coffee ground material  New chest pain or pain under the shoulder blades  Painful or persistently difficult swallowing  New shortness of breath  Fever of 100F or higher  Black, tarry-looking stools  For urgent or emergent issues, a gastroenterologist can be reached at any hour by calling [phone]. Do not use MyChart  messaging for urgent concerns.    DIET:  We do recommend a small meal at first, but then you may proceed to your regular diet.  Drink plenty of fluids but you should avoid alcoholic beverages for 24 hours.  ACTIVITY:  You should plan to take it easy for the rest of today and you should NOT DRIVE or use heavy machinery until tomorrow (because of the sedation medicines used during the test).    FOLLOW UP: Our staff will call the number listed on your records 48-72 hours following your procedure to check on you and address any questions or concerns that you may have regarding the information given to you following your procedure. If we do not reach you, we will leave a message.  We will attempt to reach you two times.  During this call, we will ask if you have developed any symptoms of COVID 19. If you develop any symptoms (ie: fever, flu-like symptoms, shortness of breath, cough etc.) before then, please call [phone].  If you test positive for Covid 19 in the 2 weeks post procedure, please call and report this information to [place].    If any biopsies were taken you will be contacted by phone or by letter within the next 1-3 weeks.  Please call us at [phone] if you have not heard about the biopsies in 3 weeks.    SIGNATURES/CONFIDENTIALITY: You and/or your care partner have signed paperwork which will be entered into your electronic medical record.  These signatures  attest to the fact that that the information above on your After Visit Summary has been reviewed and is understood.  Full responsibility of the confidentiality of this discharge information lies with you and/or your care-partner.

## 2020-03-03 NOTE — Progress Notes (Signed)
To PACU, VSS. Report to [email or web address] 

## 2020-03-06 ENCOUNTER — Ambulatory Visit
Admission: RE | Admit: 2020-03-06 | Discharge: 2020-03-06 | Disposition: A | Discharge status: Home / Self Care | Payer: Medicare Other | Source: Ambulatory Visit | Attending: Primary Care | Admitting: Primary Care

## 2020-03-06 ENCOUNTER — Other Ambulatory Visit: Payer: Self-pay

## 2020-03-06 DIAGNOSIS — N6489 Other specified disorders of breast: Secondary | ICD-10-CM

## 2020-03-07 ENCOUNTER — Telehealth: Payer: Self-pay

## 2020-03-07 NOTE — Telephone Encounter (Signed)
  Follow up Call-  Call back number 03/03/2020  Post procedure Call Back phone  # [phone]  Permission to leave phone message Yes  Some recent data might be hidden     Patient questions:  Do you have a fever, pain , or abdominal swelling? No. Pain Score  0 *  Have you tolerated food without any problems? Yes.    Have you been able to return to your normal activities? Yes.    Do you have any questions about your discharge instructions: Diet   No. Medications  No. Follow up visit  No.  Do you have questions or concerns about your Care? No.  Actions: * If pain score is 4 or above: No action needed, pain <4.  1. Have you developed a fever since your procedure? no  2.   Have you had an respiratory symptoms (SOB or cough) since your procedure? no  3.   Have you tested positive for COVID 19 since your procedure no  4.   Have you had any family members/close contacts diagnosed with the COVID 19 since your procedure?  no   If yes to any of these questions please route to Joylene John, RN and Joella Prince, RN

## 2020-03-17 DIAGNOSIS — M542 Cervicalgia: Secondary | ICD-10-CM | POA: Insufficient documentation

## 2020-03-28 ENCOUNTER — Other Ambulatory Visit: Payer: Medicare Other

## 2020-03-28 ENCOUNTER — Telehealth: Payer: Self-pay | Admitting: Gastroenterology

## 2020-03-28 NOTE — Telephone Encounter (Signed)
Spoke to patient to inform her of Dr Vivia Ewing note he sent her regarding biopsy results. All questions answered. Patient voiced understanding.

## 2020-03-29 ENCOUNTER — Other Ambulatory Visit: Payer: Self-pay | Admitting: Nurse Practitioner

## 2020-03-30 ENCOUNTER — Other Ambulatory Visit: Payer: Self-pay | Admitting: Nurse Practitioner

## 2020-04-20 ENCOUNTER — Other Ambulatory Visit: Payer: Self-pay | Admitting: Nurse Practitioner

## 2020-04-22 ENCOUNTER — Other Ambulatory Visit: Payer: Self-pay | Admitting: Nurse Practitioner

## 2020-04-26 ENCOUNTER — Telehealth: Payer: Self-pay | Admitting: Primary Care

## 2020-04-26 NOTE — Telephone Encounter (Signed)
Will look into this.

## 2020-04-26 NOTE — Telephone Encounter (Signed)
Lauren Howard, is this something you are able to handle?

## 2020-04-26 NOTE — Telephone Encounter (Signed)
Patient called in stating that she was in our office back on 12/2019 to get a tetanus shot. She states that she received the shot due to a cat bite . She is receiving a bill from [place] stating that Medicare will not cover this because there was no notation of a cat bite and it needs to have an addendum to say "due to cat bite or animal bite" so they will cover it. Please advise the patient EM

## 2020-04-26 NOTE — Telephone Encounter (Signed)
There was actually no office visit for the Tdap regarding the cat bite as patient sent a MyChart message on November 10 or 11, 2021 reporting a cat bite.  Dr. Diona Browner approved the Tdap as I was out of the office, Kem Parkinson, CMA administered. It looks like the vaccine was linked to a diagnosis of "need for Tdap".  I am not sure how to change this but I am more than willing to try.  I am open to any ideas.

## 2020-04-28 DIAGNOSIS — M542 Cervicalgia: Secondary | ICD-10-CM

## 2020-04-28 HISTORY — DX: Cervicalgia: M54.2

## 2020-05-01 NOTE — Telephone Encounter (Signed)
LVM

## 2020-05-02 NOTE — Telephone Encounter (Signed)
Discussed with Leticia Penna, practice admin, who reports the bill will be taken care of for the pt.  Contacted pt and advised this will be taken care of. Advised if any further bills to contact the office. Pt appreciative and verbalized understanding.

## 2020-05-02 NOTE — Telephone Encounter (Signed)
I appreciate all who were involved.

## 2020-05-08 ENCOUNTER — Other Ambulatory Visit: Payer: Self-pay | Admitting: Nurse Practitioner

## 2020-05-16 ENCOUNTER — Other Ambulatory Visit: Payer: Self-pay

## 2020-05-16 ENCOUNTER — Encounter: Payer: Self-pay | Admitting: Primary Care

## 2020-05-16 ENCOUNTER — Other Ambulatory Visit: Payer: Self-pay | Admitting: Nurse Practitioner

## 2020-05-16 ENCOUNTER — Ambulatory Visit (INDEPENDENT_AMBULATORY_CARE_PROVIDER_SITE_OTHER): Payer: Medicare Other | Admitting: Primary Care

## 2020-05-16 VITALS — BP 124/82 | HR 84 | Temp 98.6°F | Ht 65.0 in | Wt 143.0 lb

## 2020-05-16 DIAGNOSIS — Z87440 Personal history of urinary (tract) infections: Secondary | ICD-10-CM

## 2020-05-16 DIAGNOSIS — R202 Paresthesia of skin: Secondary | ICD-10-CM

## 2020-05-16 DIAGNOSIS — R32 Unspecified urinary incontinence: Secondary | ICD-10-CM | POA: Insufficient documentation

## 2020-05-16 DIAGNOSIS — E2839 Other primary ovarian failure: Secondary | ICD-10-CM

## 2020-05-16 DIAGNOSIS — M542 Cervicalgia: Secondary | ICD-10-CM

## 2020-05-16 DIAGNOSIS — Z23 Encounter for immunization: Secondary | ICD-10-CM | POA: Diagnosis not present

## 2020-05-16 DIAGNOSIS — K580 Irritable bowel syndrome with diarrhea: Secondary | ICD-10-CM

## 2020-05-16 DIAGNOSIS — Z0000 Encounter for general adult medical examination without abnormal findings: Secondary | ICD-10-CM | POA: Diagnosis not present

## 2020-05-16 DIAGNOSIS — E7801 Familial hypercholesterolemia: Secondary | ICD-10-CM | POA: Diagnosis not present

## 2020-05-16 DIAGNOSIS — F4329 Adjustment disorder with other symptoms: Secondary | ICD-10-CM

## 2020-05-16 DIAGNOSIS — R1310 Dysphagia, unspecified: Secondary | ICD-10-CM

## 2020-05-16 LAB — CBC
HCT: 41 % (ref 36.0–46.0)
Hemoglobin: 13.8 g/dL (ref 12.0–15.0)
MCHC: 33.7 g/dL (ref 30.0–36.0)
MCV: 88.6 fl (ref 78.0–100.0)
Platelets: 297 10*3/uL (ref 150.0–400.0)
RBC: 4.63 Mil/uL (ref 3.87–5.11)
RDW: 13.2 % (ref 11.5–15.5)
WBC: 6 10*3/uL (ref 4.0–10.5)

## 2020-05-16 LAB — POC URINALSYSI DIPSTICK (AUTOMATED)
Bilirubin, UA: NEGATIVE
Blood, UA: NEGATIVE
Glucose, UA: NEGATIVE
Ketones, UA: NEGATIVE
Leukocytes, UA: NEGATIVE
Nitrite, UA: NEGATIVE
Protein, UA: NEGATIVE
Spec Grav, UA: 1.025 (ref 1.010–1.025)
Urobilinogen, UA: 0.2 E.U./dL
pH, UA: 6 (ref 5.0–8.0)

## 2020-05-16 LAB — BASIC METABOLIC PANEL
BUN: 10 mg/dL (ref 6–23)
CO2: 32 mEq/L (ref 19–32)
Calcium: 9.8 mg/dL (ref 8.4–10.5)
Chloride: 101 mEq/L (ref 96–112)
Creatinine, Ser: 0.55 mg/dL (ref 0.40–1.20)
GFR: 96.02 mL/min (ref >60.00)
Glucose, Bld: 86 mg/dL (ref 70–99)
Potassium: 4.4 mEq/L (ref 3.5–5.1)
Sodium: 139 mEq/L (ref 135–145)

## 2020-05-16 LAB — LIPID PANEL
Cholesterol: 248 mg/dL — ABNORMAL HIGH (ref 0–200)
HDL: 59.5 mg/dL (ref >39.00)
LDL Cholesterol: 159 mg/dL — ABNORMAL HIGH (ref 0–99)
NonHDL: 188.75
Total CHOL/HDL Ratio: 4
Triglycerides: 150 mg/dL — ABNORMAL HIGH (ref 0.0–149.0)
VLDL: 30 mg/dL (ref 0.0–40.0)

## 2020-05-16 LAB — VITAMIN B12: Vitamin B-12: >1506 pg/mL — ABNORMAL HIGH (ref 211–911)

## 2020-05-16 NOTE — Assessment & Plan Note (Signed)
Discussed the importance of a healthy diet and regular exercise in order for weight loss, and to reduce the risk of any potential medical problems.  Repeat lipid panel pending.

## 2020-05-16 NOTE — Assessment & Plan Note (Signed)
Chronic, ongoing. Following with Dr. Nelva Bush.

## 2020-05-16 NOTE — Assessment & Plan Note (Signed)
Improved since endoscopy and esophageal stretching in January 2022. Continue to monitor.

## 2020-05-16 NOTE — Patient Instructions (Signed)
Stop by the lab prior to leaving today. I will notify you of your results once received.   Continue exercising. You should be getting 150 minutes of moderate intensity exercise weekly.  Continue to work on a healthy diet. Ensure you are consuming 64 ounces of water daily.  [facility] to [facility] your [facility] density scan.   It was a pleasure to see you today!

## 2020-05-16 NOTE — Assessment & Plan Note (Signed)
Improved on dicyclomine 10 mg for which she is taking 1-2 times daily. Continue same.

## 2020-05-16 NOTE — Assessment & Plan Note (Signed)
Overall improving, denies concerns for anxiety or depression today. Continue to monitor.

## 2020-05-16 NOTE — Addendum Note (Signed)
Addended by: Tammi Sou on: 05/16/2020 12:17 PM   Modules accepted: Orders

## 2020-05-16 NOTE — Assessment & Plan Note (Signed)
I have personally reviewed and have noted: 1. The patient's medical and social history 2. Their use of alcohol, tobacco or illicit drugs 3. Their current medications and supplements 4. The patient's functional ability including ADL's, fall risks, home safety risks and  hearing or visual impairment. 5. Diet and physical activities 6. Evidence for depression or mood disorder  Prevnar due, provided today. Declines Shingrix given lack of insurance coverage.  Bone density scan due, orders placed. Mammogram UTD. Colonoscopy UTD, due in 2032.  Discussed the importance of a healthy diet and regular exercise in order for weight loss, and to reduce the risk of any potential medical problems.  Exam today stable. Labs pending.

## 2020-05-16 NOTE — Assessment & Plan Note (Signed)
Leaking after urination and after moderate physical activity.   Discussed options including Kegal exercises, pelvic floor PT, medication. She will start with Kegal exercises and update.

## 2020-05-16 NOTE — Progress Notes (Signed)
HPI: Lauren Howard is a 65 year old female who presents today for Welcome to Medicare Visit and follow up of chronic conditions. She would also like her urine tested for UTI.  She is not experiencing urinary symptoms, she would just like her urine checked given history of severe UTI in December 2021.  She would also like to discuss urinary incontinence, notices that she leaks urine after urination or after physical exertion. She does get up 2-3 times nightly to urinate.    Past Medical History:  Diagnosis Date  . Allergy   . Anal fissure   . Arthritis   . Carpal tunnel syndrome   . Chickenpox   . Ear pain   . Frequent headaches   . Genital warts   . Hypercholesterolemia   . Hyperlipidemia   . Kidney stones   . Obesity     Current Outpatient Medications  Medication Sig Dispense Refill  . Ascorbic Acid (VITAMIN C) 1000 MG tablet Take 1,000 mg by mouth daily. With rose hips    . Cholecalciferol (VITAMIN D3) 50 MCG (2000 UT) TABS Take 1 tablet by mouth daily.    . Coenzyme Q10 (COQ10) 200 MG CAPS Take 1 capsule by mouth daily.    . Cyanocobalamin (B-12) 3000 MCG CAPS Take 1 capsule by mouth daily.    Marland Kitchen dicyclomine (BENTYL) 10 MG capsule TAKE 1 CAPSULE (10 MG TOTAL) BY MOUTH 4 (FOUR) TIMES DAILY - BEFORE MEALS AND AT BEDTIME. 90 capsule 0  . EPINEPHrine (EPIPEN 2-PAK) 0.3 mg/0.3 mL IJ SOAJ injection Inject 0.3 mLs (0.3 mg total) into the muscle as needed for anaphylaxis. 1 each 0  . Magnesium 100 MG CAPS Take by mouth.    . Menthol, Topical Analgesic, (BIOFREEZE ROLL-ON) 4 % GEL Biofreeze (menthol) 4 % topical gel  Apply 1 application 4 times a day by topical route as needed for 30 days.    . Omega-3 Fatty Acids (FISH OIL) 1200 MG CAPS     . omeprazole (PRILOSEC) 20 MG capsule TAKE 1 CAPSULE (20 MG TOTAL) BY MOUTH 2 (TWO) TIMES DAILY BEFORE A MEAL. 60 capsule 1  . OVER THE COUNTER MEDICATION GOLI Ashwa gummies- 2-3 a day at bedtime to help sleep    . OVER THE COUNTER MEDICATION Gut  connect 365- mix with water daily     Current Facility-Administered Medications  Medication Dose Route Frequency Provider Last Rate Last Admin  . 0.9 %  sodium chloride infusion  500 mL Intravenous Once Cirigliano, Vito V, DO        Allergies  Allergen Reactions  . Bee Venom Anaphylaxis  . Penicillins Rash and Other (See Comments)    Rash, swelling Other reaction(s): Other (See Comments) Rash, swelling  Rash and swelling  . Erythromycin Base Other (See Comments) and Nausea Only  . Erythromycin Nausea Only    rash rash nausea  . Other Rash    rash rash Triple ani-tbiotic ointment    Family History  Problem Relation Age of Onset  . Hypertension Mother   . Diabetes Mother   . Depression Mother   . Arthritis Mother   . Dementia Mother   . Skin cancer Father   . Heart disease Father   . Hypertension Father   . Hyperlipidemia Father   . Dementia Father   . Prostate cancer Father   . Colon cancer Neg Hx   . Esophageal cancer Neg Hx   . Rectal cancer Neg Hx   . Stomach cancer Neg  Hx     Social History   Socioeconomic History  . Marital status: Married    Spouse name: Not on file  . Number of children: 2  . Years of education: Not on file  . Highest education level: Not on file  Occupational History  . Occupation: retired  Tobacco Use  . Smoking status: Never Smoker  . Smokeless tobacco: Never Used  Vaping Use  . Vaping Use: Never used  Substance and Sexual Activity  . Alcohol use: Yes    Comment: occassional wine  . Drug use: No  . Sexual activity: Not on file  Other Topics Concern  . Not on file  Social History Narrative   Married.   Social Determinants of Health   Financial Resource Strain: Not on file  Food Insecurity: Not on file  Transportation Needs: Not on file  Physical Activity: Not on file  Stress: Not on file  Social Connections: Not on file  Intimate Partner Violence: Not on file    Hospitiliaztions: None, only ED visit  Health  Maintenance:    Flu: Declines   Tetanus: 2021  Pneumovax: Never Completed  Prevnar: Never Completed   Shingles: Never completed, not covered by insurance.   Bone Density: Never completed   Colonoscopy: Completed in January 2022, due again in 2032  Eye Doctor: Completes annually, she is due  Dental Exam: Completes semi-annually  Mammogram: UTD, last completed in January 2022   Hep C Screening: Negative    Providers: Alma Friendly, PCP; Dr. Nelva Bush, Physical Medicine, GI   I have personally reviewed and have noted: 1. The patient's medical and social history 2. Their use of alcohol, tobacco or illicit drugs 3. Their current medications and supplements 4. The patient's functional ability including ADL's, fall risks, home safety risks and  hearing or visual impairment. 5. Diet and physical activities 6. Evidence for depression or mood disorder  Subjective:   Review of Systems:   Constitutional: Denies fever, malaise, headache or abrupt weight changes. Chronic fatigue.  HEENT: Denies eye pain, eye redness, ear pain, ringing in the ears, wax buildup, runny nose, nasal congestion, bloody nose, or sore throat. Respiratory: Denies difficulty breathing, shortness of breath, cough or sputum production.   Cardiovascular: Denies chest pain, chest tightness, palpitations or swelling in the hands or feet.  Gastrointestinal: intermittent abdominal cramping and diarrhea, improved with dicyclomine.  GU: Urinary leakage, see above. Musculoskeletal: Chronic joint aches, neck pain.  Skin: Denies redness, rashes, lesions or ulcercations.  Neurological: Denies dizziness, difficulty with memory, difficulty with speech or problems with balance and coordination. Paresthesias to toes at night.  Psychiatric: Denies concerns for anxiety or depression.   No other specific complaints in a complete review of systems (except as listed in HPI above).  Objective:  PE:   BP 124/82   Pulse 84   Temp 98.6  F (37 C) (Temporal)   Ht 5\' 5"  (1.651 m)   Wt 143 lb (64.9 kg)   SpO2 99%   BMI 23.80 kg/m  Wt Readings from Last 3 Encounters:  05/16/20 143 lb (64.9 kg)  03/03/20 150 lb (68 kg)  03/01/20 152 lb (68.9 kg)    General: Appears their stated age, well developed, well nourished in NAD. Skin: Warm, dry and intact. No rashes, lesions or ulcerations noted. HEENT: Head: normal shape and size; Eyes: sclera white, no icterus, conjunctiva pink, PERRLA and EOMs intact; Ears: Tm's gray and intact, normal light reflex; Nose: mucosa pink and moist, septum midline;  Throat/Mouth: Teeth present, mucosa pink and moist, no exudate, lesions or ulcerations noted.  Neck: Normal range of motion. Neck supple, trachea midline. No massses, lumps or thyromegaly present.  Cardiovascular: Normal rate and rhythm. S1,S2 noted.  No murmur, rubs or gallops noted. No JVD or BLE edema. No carotid bruits noted. Pulmonary/Chest: Normal effort and positive vesicular breath sounds. No respiratory distress. No wheezes, rales or ronchi noted.  Abdomen: Soft and nontender. Normal bowel sounds, no bruits noted. No distention or masses noted. Liver, spleen and kidneys non palpable. Musculoskeletal: Normal range of motion. No signs of joint swelling. No difficulty with gait.  Neurological: Alert and oriented. Cranial nerves II-XII intact. Coordination normal. +DTRs bilaterally. Psychiatric: Mood and affect normal. Behavior is normal. Judgment and thought content normal.   EKG: NSR with rate of 65, no PAC/PVC. No ST elevation. No old ECG to compare.   BMET    Component Value Date/Time   NA 140 02/16/2020 2120   K 3.4 (L) 02/16/2020 2120   CL 105 02/16/2020 2120   CO2 25 02/16/2020 2120   GLUCOSE 130 (H) 02/16/2020 2120   BUN 13 02/16/2020 2120   CREATININE 0.64 02/16/2020 2120   CREATININE 0.66 05/07/2019 1457   CALCIUM 9.4 02/16/2020 2120   GFRNONAA >60 02/16/2020 2120   GFRAA >60 10/17/2014 1545    Lipid Panel      Component Value Date/Time   CHOL 171 07/12/2019 1110   TRIG 135.0 07/12/2019 1110   HDL 54.00 07/12/2019 1110   CHOLHDL 3 07/12/2019 1110   VLDL 27.0 07/12/2019 1110   LDLCALC 90 07/12/2019 1110    CBC    Component Value Date/Time   WBC 12.4 (H) 02/16/2020 2120   RBC 4.52 02/16/2020 2120   HGB 13.5 02/16/2020 2120   HCT 41.3 02/16/2020 2120   PLT 299 02/16/2020 2120   MCV 91.4 02/16/2020 2120   MCH 29.9 02/16/2020 2120   MCHC 32.7 02/16/2020 2120   RDW 12.5 02/16/2020 2120   LYMPHSABS 3.0 02/16/2020 2120   MONOABS 0.7 02/16/2020 2120   EOSABS 0.1 02/16/2020 2120   BASOSABS 0.1 02/16/2020 2120    Hgb A1C No results found for: HGBA1C    Assessment and Plan:   Medicare Annual Wellness Visit:  Physical activity: Active Depression/mood screen: Negative Hearing: Intact to whispered voice Visual acuity: Grossly normal, performs annual eye exam  ADLs: Capable Fall risk: None Home safety: Good Cognitive evaluation: Intact to orientation, naming, recall and repetition EOL planning: Adv directives, full code/ I agree  Preventative Medicine: Prevnar due, provided today. Declines Shingrix given lack of insurance coverage.  Bone density scan due, orders placed. Mammogram UTD. Colonoscopy UTD, due in 2032.  Discussed the importance of a healthy diet and regular exercise in order for weight loss, and to reduce the risk of any potential medical problems.  Exam today stable. Labs pending.  Next appointment: One year

## 2020-05-17 DIAGNOSIS — E7801 Familial hypercholesterolemia: Secondary | ICD-10-CM

## 2020-05-19 MED ORDER — ROSUVASTATIN CALCIUM 10 MG PO TABS: 10.0000 mg | ORAL_TABLET | Freq: Every day | ORAL | 3 refills | Status: DC

## 2020-06-02 DIAGNOSIS — E2839 Other primary ovarian failure: Secondary | ICD-10-CM

## 2020-06-02 NOTE — Telephone Encounter (Signed)
Lauren Howard, can you fax this to [facility] on 06/05/20?

## 2020-06-05 NOTE — Telephone Encounter (Signed)
Order faxed.

## 2020-06-22 LAB — HM DEXA SCAN

## 2020-06-27 ENCOUNTER — Encounter: Payer: Self-pay | Admitting: Primary Care

## 2020-07-03 ENCOUNTER — Encounter: Payer: Self-pay | Admitting: Primary Care

## 2020-07-05 NOTE — Addendum Note (Signed)
Addended by: Pleas Koch on: 07/05/2020 04:55 PM   Modules accepted: Orders

## 2020-07-10 ENCOUNTER — Other Ambulatory Visit: Payer: Self-pay

## 2020-07-10 ENCOUNTER — Other Ambulatory Visit (INDEPENDENT_AMBULATORY_CARE_PROVIDER_SITE_OTHER): Payer: Medicare Other

## 2020-07-10 DIAGNOSIS — E2839 Other primary ovarian failure: Secondary | ICD-10-CM | POA: Diagnosis not present

## 2020-07-10 LAB — VITAMIN D 25 HYDROXY (VIT D DEFICIENCY, FRACTURES): VITD: 46.9 ng/mL (ref 30.00–100.00)

## 2020-07-31 DIAGNOSIS — M5030 Other cervical disc degeneration, unspecified cervical region: Secondary | ICD-10-CM | POA: Insufficient documentation

## 2020-08-02 DIAGNOSIS — M79672 Pain in left foot: Secondary | ICD-10-CM | POA: Insufficient documentation

## 2020-08-13 ENCOUNTER — Other Ambulatory Visit: Payer: Self-pay | Admitting: Nurse Practitioner

## 2020-09-03 ENCOUNTER — Other Ambulatory Visit: Payer: Self-pay | Admitting: Nurse Practitioner

## 2020-09-05 DIAGNOSIS — K219 Gastro-esophageal reflux disease without esophagitis: Secondary | ICD-10-CM | POA: Insufficient documentation

## 2020-09-05 DIAGNOSIS — I10 Essential (primary) hypertension: Secondary | ICD-10-CM | POA: Insufficient documentation

## 2020-09-05 DIAGNOSIS — A630 Anogenital (venereal) warts: Secondary | ICD-10-CM | POA: Insufficient documentation

## 2020-09-05 DIAGNOSIS — S0990XA Unspecified injury of head, initial encounter: Secondary | ICD-10-CM | POA: Insufficient documentation

## 2020-09-18 ENCOUNTER — Telehealth: Payer: Self-pay | Admitting: Primary Care

## 2020-09-18 NOTE — Telephone Encounter (Signed)
[facility] RECORD [facility] Patient Name: Lauren Howard R Gender: Female DOB: 1955-03-29 Age: 65 Y 1 D Return Phone Number: [id number] (Primary) Address[place]  [address] Client [facility] Night - Client Client Site [facility] Physician Alma Friendly - NP Contact Type Call Who Is Calling Patient / Member / Family / Caregiver Call Type Triage / Clinical Relationship To Patient Self Return Phone Number [phone] (Primary) Chief Complaint CHEST PAIN - pain, pressure, heaviness or tightness Reason for Call Symptomatic / Request for [place] states she had spine surgery Tuesday and was having pain in her throat and difficulty swallowing. She has tested positive for covid andis c/o chest tightness, cough, very sore throat that deels swollen. She is also c/o pain in her arm and shoulder from surgery. [facility] ED Translation No Nurse Assessment Nurse: Reasor, RN, Denny Peon Date/Time (Eastern Time): 09/16/2020 9:17:52 AM Confirm and document reason for call. If symptomatic, describe symptoms. ---Caller states she had spine surgery Tuesday and was having pain in her throat and difficulty swallowing, now COVID +, c/o chest tightness, cough, very sore throat that feels swollen. Pt also c/o pain in her arm and shoulder from surgery. Does the patient have any new or worsening symptoms? ---Yes Will a triage be completed? ---Yes Related visit to physician within the last 2 weeks? ---Yes Does the PT have any chronic conditions? (i.e. diabetes, asthma, this includes High risk factors for pregnancy, etc.) ---Yes List chronic conditions. ---high cholesterol GERD Is this a behavioral health or substance abuse call? ---No Guidelines Guideline Title Affirmed Question Affirmed Notes  Nurse Date/Time ([place] Time) COVID-19 - Diagnosed or Suspected SEVERE or constant chest pain or pressure (Exception: Mild Reasor, RN, Denny Peon 09/16/2020 9:19:39 AM PLEASE NOTE: All timestamps contained within this report are represented as [place] Standard Time. CONFIDENTIALTY NOTICE: This fax transmission is intended only for the addressee. It contains information that is legally privileged, confidential or otherwise protected from use or disclosure. If you are not the intended recipient, you are strictly prohibited from reviewing, disclosing, copying using or disseminating any of this information or taking any action in reliance on or regarding this information. If you have received this fax in error, please notify us immediately by telephone so that we can arrange for its return to [place]. Phone: [phone], Toll-Free: [phone], Fax: [phone] Page: 2 of 2 Call Id: [id number] Guidelines Guideline Title Affirmed Question Affirmed Notes Nurse Date/Time Eilene Ghazi Time) central chest pain, present only when coughing.) Disp. Time Eilene Ghazi Time) Disposition Final User 09/16/2020 9:17:06 AM Send to Urgent Queue Kathlynn Grate 09/16/2020 9:23:14 AM Go to ED Now Yes Reasor, RN, Denny Peon Caller Disagree/Comply Comply Caller Understands Yes PreDisposition InappropriateToAsk Care Advice Given Per Guideline GO TO ED NOW: * Leave now. Drive carefully. * Tell them you have symptoms and have been sent for COVID-19 testing. CARE ADVICE given per COVID-19 - DIAGNOSED OR SUSPECTED (Adult) guideline. Referrals GO TO FACILITY OTHER - SPECIFY

## 2020-09-18 NOTE — Telephone Encounter (Signed)
Mrs. Ort called in due to she recently had surgery and her incision look swollen and wanted to get [facility] opinion. She got covid while in the hospital and they gave her paxolvid but she was told not to take the pain medication. And they while in the hospital they gave her cough syrup with codine in it. Wanted to know about what to do.

## 2020-09-18 NOTE — Telephone Encounter (Signed)
Called patient she had surgery on neck 09/12/2020. In hospital 2 days came home. On Friday started feeling bad home covid test was positive. Was seen at [facility] over the weekend. Very confused on instructions. Per discharge from ed was told  Please follow-up with your primary care physician, have called in prescriptions for Paxil but as well as promethazine-codeine to the pharmacy at [facility] in [place]. Please stop your Norco (hydrocodone-acetaminophen), please use the Robaxin/methocarbamol as needed for muscle relaxation. The cough syrup has some pain relief and at that should also help. Follow-up with neurosurgery as scheduled. I have reviewed all information with patient. She understood instructions. She has talked to surgeon this morning about incision and has a virtual in the morning and given instructions for how to address until then. She will call office if any changes. But let her know that for now all instructions regarding pain medication/and incision need to come from surgeon. No further questions at this time.

## 2020-09-18 NOTE — Telephone Encounter (Signed)
Noted. Appreciate CMA follow-up.

## 2020-09-18 NOTE — Telephone Encounter (Signed)
Called patient she had surgery on neck 09/12/2020. In hospital 2 days came home. On Friday started feeling bad home covid test was positive. Was seen at [facility] over the weekend. Very confused on instructions. Per discharge from ed was told  Please follow-up with your primary care physician, have called in prescriptions for Paxil but as well as promethazine-codeine to the pharmacy at [facility] in [place]. Please stop your Norco (hydrocodone-acetaminophen), please use the Robaxin/methocarbamol as needed for muscle relaxation. The cough syrup has some pain relief and at that should also help. Follow-up with neurosurgery as scheduled. I have reviewed all information with patient. She understood instructions. She has talked to surgeon this morning about incision and has a virtual in the morning and given instructions for how to address until then. She will call office if any changes. But let her know that for now all instructions regarding pain medication/and incision need to come from surgeon. No further questions at this time.   Patient also sent in pictures in my chart message have added this information to that message as well.

## 2020-09-19 DIAGNOSIS — Z981 Arthrodesis status: Secondary | ICD-10-CM | POA: Insufficient documentation

## 2020-09-19 NOTE — Telephone Encounter (Signed)
Noted and appreciate the help!

## 2020-09-21 ENCOUNTER — Encounter (HOSPITAL_COMMUNITY): Payer: Self-pay | Admitting: *Deleted

## 2020-09-21 ENCOUNTER — Telehealth: Payer: Self-pay

## 2020-09-21 ENCOUNTER — Ambulatory Visit (HOSPITAL_COMMUNITY)
Admission: EM | Admit: 2020-09-21 | Discharge: 2020-09-21 | Disposition: A | Discharge status: Home / Self Care | Payer: Medicare Other | Attending: Emergency Medicine | Admitting: Emergency Medicine

## 2020-09-21 ENCOUNTER — Other Ambulatory Visit: Payer: Self-pay

## 2020-09-21 DIAGNOSIS — N390 Urinary tract infection, site not specified: Secondary | ICD-10-CM | POA: Diagnosis not present

## 2020-09-21 LAB — POCT URINALYSIS DIPSTICK, ED / UC
Bilirubin Urine: NEGATIVE
Glucose, UA: NEGATIVE mg/dL
Ketones, ur: NEGATIVE mg/dL
Nitrite: NEGATIVE
Protein, ur: 30 mg/dL — AB
Specific Gravity, Urine: 1.02 (ref 1.005–1.030)
Urobilinogen, UA: 0.2 mg/dL (ref 0.0–1.0)
pH: 7 (ref 5.0–8.0)

## 2020-09-21 MED ORDER — NITROFURANTOIN MONOHYD MACRO 100 MG PO CAPS: 100.0000 mg | ORAL_CAPSULE | Freq: Two times a day (BID) | ORAL | 0 refills | Status: DC

## 2020-09-21 NOTE — Discharge Instructions (Addendum)
Take the macrobid twice a day for the next 5 days.    You can take Tylenol and/or Ibuprofen as needed for pain relief and fever reduction.   Make sure you are drinking plenty of fluids, especially water.  You can drink cranberry juice to help with symptom relief, but make sure it is cranberry juice and not cranberry cocktail.  You can also try AZO, cranberry pills, or pyridium as needed.    Return or go to the Emergency Department if symptoms worsen or do not improve in the next few days.  

## 2020-09-21 NOTE — Telephone Encounter (Signed)
Pt came to clinic at 1650 with urine sample and requested urine be tested for UTI. Discussed with PCP and no apts are available to day or tomorrow. Recommendation to go to UC.  Pt c/o urinary frequency and that she knows something is wrong because she had to be catheterized for her recent surgery and she is worried. She said the last time she had UTI she also had frequency and no other symptoms. Pt denies any other symptoms. Advised clinic could not take urine and provider do not have apt this late. Advised pt to go to [facility] for assessment. Pt again requested if we could just test the urine for UTI. Advised that could not be done without a order from provider. Advised currently there are no apts available with any provider in the office tomorrow and she should go to an UC. She requested that a msg be sent to her PCP. Advised a msg would be sent to PCP and f/u would occur. Advised of ER precautions and if any symptoms changed or she developed any new symptoms to contact office. Advised again to go to UC. Pt verbalized understanding but would not comply with recommendation.

## 2020-09-21 NOTE — ED Provider Notes (Signed)
[facility]   CC: UTI  SUBJECTIVE:  Lauren Howard is a 65 y.o. female who complains of urinary frequency, urgency and pressure that started yesterday  Patient denies a precipitating event, recent sexual encounter, excessive caffeine intake. Localizes the pain to the lower abdomen.  Pain is intermittent and describes it as pressure.  Has tried OTC medications without relief.  Symptoms are made worse with urination. Admits to similar symptoms in the past with UTIs.  Denies fever, chills, nausea, vomiting, flank pain, abnormal vaginal discharge or bleeding, hematuria.    LMP: No LMP recorded. Patient is postmenopausal.  ROS: As in HPI.  All other pertinent ROS negative.     Past Medical History:  Diagnosis Date   Allergy    Anal fissure    Arthritis    Carpal tunnel syndrome    Chickenpox    Ear pain    Frequent headaches    Genital warts    Hypercholesterolemia    Hyperlipidemia    Kidney stones    Neck pain on left side 04/28/2020   Obesity    Past Surgical History:  Procedure Laterality Date   CERVICAL FUSION     July 2022 @ [facility]     HAND SURGERY Left 2018   Allergies  Allergen Reactions   Bee Venom Anaphylaxis   Penicillins Rash and Other (See Comments)    Rash, swelling Other reaction(s): Other (See Comments) Rash, swelling  Rash and swelling   Erythromycin Base Other (See Comments) and Nausea Only   Erythromycin Nausea Only    rash rash nausea   Other Rash    rash rash Triple ani-tbiotic ointment   Current Facility-Administered Medications on File Prior to Encounter  Medication Dose Route Frequency Provider Last Rate Last Admin   0.9 %  sodium chloride infusion  500 mL Intravenous Once Cirigliano, Vito V, DO       Current Outpatient Medications on File Prior to Encounter  Medication Sig Dispense Refill   HYDROCODONE-ACETAMINOPHEN PO Take by mouth.     methocarbamol (ROBAXIN) 750 MG  tablet Take 750 mg by mouth 4 (four) times daily.     rosuvastatin (CRESTOR) 10 MG tablet Take 1 tablet (10 mg total) by mouth daily. For cholesterol. 90 tablet 3   Ascorbic Acid (VITAMIN C) 1000 MG tablet Take 1,000 mg by mouth daily. With rose hips     Cholecalciferol (VITAMIN D3) 50 MCG (2000 UT) TABS Take 1 tablet by mouth daily.     Coenzyme Q10 (COQ10) 200 MG CAPS Take 1 capsule by mouth daily.     Cyanocobalamin (B-12) 3000 MCG CAPS Take 1 capsule by mouth daily.     dicyclomine (BENTYL) 10 MG capsule TAKE 1 CAPSULE (10 MG TOTAL) BY MOUTH 4 (FOUR) TIMES DAILY - BEFORE MEALS AND AT BEDTIME. 90 capsule 0   EPINEPHrine (EPIPEN 2-PAK) 0.3 mg/0.3 mL IJ SOAJ injection Inject 0.3 mLs (0.3 mg total) into the muscle as needed for anaphylaxis. 1 each 0   Magnesium 100 MG CAPS Take by mouth.     Menthol, Topical Analgesic, (BIOFREEZE ROLL-ON) 4 % GEL Biofreeze (menthol) 4 % topical gel  Apply 1 application 4 times a day by topical route as needed for 30 days.     Omega-3 Fatty Acids (FISH OIL) 1200 MG CAPS      omeprazole (PRILOSEC) 20 MG capsule TAKE 1 CAPSULE (20 MG TOTAL) BY MOUTH 2 (TWO) TIMES  DAILY BEFORE A MEAL. 180 capsule 0   OVER THE COUNTER MEDICATION GOLI Ashwa gummies- 2-3 a day at bedtime to help sleep     OVER THE COUNTER MEDICATION Gut connect 365- mix with water daily     Social History   Socioeconomic History   Marital status: Married    Spouse name: Not on file   Number of children: 2   Years of education: Not on file   Highest education level: Not on file  Occupational History   Occupation: retired  Tobacco Use   Smoking status: Never   Smokeless tobacco: Never  Vaping Use   Vaping Use: Never used  Substance and Sexual Activity   Alcohol use: Not Currently    Comment: occasionally   Drug use: No   Sexual activity: Not on file  Other Topics Concern   Not on file  Social History Narrative   Married.   Social Determinants of Health   Financial Resource Strain:  Not on file  Food Insecurity: Not on file  Transportation Needs: Not on file  Physical Activity: Not on file  Stress: Not on file  Social Connections: Not on file  Intimate Partner Violence: Not on file   Family History  Problem Relation Age of Onset   Hypertension Mother    Diabetes Mother    Depression Mother    Arthritis Mother    Dementia Mother    Skin cancer Father    Heart disease Father    Hypertension Father    Hyperlipidemia Father    Dementia Father    Prostate cancer Father    Colon cancer Neg Hx    Esophageal cancer Neg Hx    Rectal cancer Neg Hx    Stomach cancer Neg Hx     OBJECTIVE:  Vitals:   09/21/20 1812  BP: (!) 154/79  Pulse: 84  Resp: 20  Temp: 98.3 F (36.8 C)  TempSrc: Oral  SpO2: 97%   General appearance: AOx3 in no acute distress HEENT: NCAT. Oropharynx clear.  Lungs: clear to auscultation bilaterally without adventitious breath sounds Heart: regular rate and rhythm. Radial pulses 2+ symmetrical bilaterally Abdomen: soft; non-distended; no tenderness; bowel sounds present; no guarding or rebound tenderness Back: no CVA tenderness Extremities: no edema; symmetrical with no gross deformities Skin: warm and dry Neurologic: Ambulates from chair to exam table without difficulty Psychological: alert and cooperative; normal mood and affect  Labs Reviewed  POCT URINALYSIS DIPSTICK, ED / UC - Abnormal; Notable for the following components:      Result Value   Hgb urine dipstick SMALL (*)    Protein, ur 30 (*)    Leukocytes,Ua MODERATE (*)    All other components within normal limits  URINE CULTURE    ASSESSMENT & PLAN:  1. Lower urinary tract infectious disease     Meds ordered this encounter  Medications   nitrofurantoin, macrocrystal-monohydrate, (MACROBID) 100 MG capsule    Sig: Take 1 capsule (100 mg total) by mouth 2 (two) times daily.    Dispense:  10 capsule    Refill:  0    Order Specific Question:   Supervising Provider     Answer:   Chase Picket [id number]    Urine culture sent  We will call you with abnormal results that need further treatment Push fluids and get plenty of rest Take antibiotic as directed and to completion Follow up with PCP if symptoms persists Return here or go to ER if you have  any new or worsening symptoms such as fever, worsening abdominal pain, nausea/vomiting, flank pain  Outlined signs and symptoms indicating need for more acute intervention Patient verbalized understanding After Visit Summary given      Pearson Forster, NP 09/21/20 1903

## 2020-09-21 NOTE — Telephone Encounter (Signed)
[facility] - Client TELEPHONE ADVICE RECORD [facility] Patient Name: Lauren Howard R Gender: Female DOB: 04-28-1955 Age: 65 Y 49 D Return Phone Number: [phone] (Primary) Address: [place]  [address] Client [facility] - Client Client Site [facility] - Day Physician Alma Friendly - NP Contact Type Call Who Is Calling Patient / Member / Family / Caregiver Call Type Triage / Clinical Relationship To Patient Self Return Phone Number [phone] (Primary) Chief Complaint Cough Reason for Call Symptomatic / Request for Health Information Initial Comment Pt may have a uti, has a cough, urinating more frequently, and has further questions. Translation No Nurse Assessment Nurse: Martyn Ehrich, RN, Felicia Date/Time (Eastern Time): 09/21/2020 4:21:48 PM Confirm and document reason for call. If symptomatic, describe symptoms. ---Pt had neck surgery at July 19 at [facility] (fusion). She and her husband both got covid (tested positive on Friday) while she was at hospital. She still has a cough and they put her on hydrocodone and muscle relaxer. She was put on a codiene cough med when she was seen at ER Sat and therefore she could not use her pain pill while on the codeine cough med - yesterday was her first day to feel better but worse today and in the last hour she is having bladder pressure and frequency. Also she is having a lot of pain from surgery. Now her neck pain is level 8. She stopped the cough syrup yesterday bc it is improved and is back on the pain med and muscle relaxers but she cant take it until dinner. Does the patient have any new or worsening symptoms? ---Yes Will a triage be completed? ---Yes Related visit to physician within the last 2 weeks? ---Yes Does the PT have any chronic conditions? (i.e. diabetes, asthma, this includes High risk factors  for pregnancy, etc.) ---No Is this a behavioral health or substance abuse call? ---No Guidelines Guideline Title Affirmed Question Affirmed Notes Nurse Date/Time (Eastern Time) Urinary Symptoms [1] Unable to urinate (or only a few drops) Martyn Ehrich, RN, Solmon Ice [id number] PM PLEASE NOTE: All timestamps contained within this report are represented as [place] Standard Time. CONFIDENTIALTY NOTICE: This fax transmission is intended only for the addressee. It contains information that is legally privileged, confidential or otherwise protected from use or disclosure. If you are not the intended recipient, you are strictly prohibited from reviewing, disclosing, copying using or disseminating any of this information or taking any action in reliance on or regarding this information. If you have received this fax in error, please notify us immediately by telephone so that we can arrange for its return to [place]. Phone: [phone], Toll-Free: [phone], Fax: [phone] Page: 2 of 3 Call Id: [id number] Guidelines Guideline Title Affirmed Question Affirmed Notes Nurse Date/Time Eilene Ghazi Time) > 4 hours AND [2] bladder feels very full (e.g., palpable bladder or strong urge to urinate) Disp. Time Eilene Ghazi Time) Disposition Final User 09/21/2020 4:38:15 PM Send To RN Personal Martyn Ehrich, RN, Solmon Ice [id number] PM Go to ED Now Yes Martyn Ehrich, RN, Alphia Kava Disagree/Comply Disagree Caller Understands Yes PreDisposition Did not know what to do Care Advice Given Per Guideline GO TO ED NOW: * Go to the ED at ___________ [place] now. Drive carefully. Comments User: Daphene Calamity, RN Date/Time Eilene Ghazi Time): 09/21/2020 4:27:24 PM covid is better than when she was at ER - denies sob at rest or walking and denies chest pain. User:  Daphene Calamity, RN Date/Time Eilene Ghazi Time): 09/21/2020 4:27:46 PM NO FEVER User: Daphene Calamity, RN Date/Time Eilene Ghazi Time): 09/21/2020 4:35:43 PM she  refuses ER disposition and changed her answer and said she is urinating more than a few drops at a time in the last 4 h. She declined triage for post op. Told her The office will be notified that she wants to speak with them and she should expect a call back shortly User: Daphene Calamity, RN Date/Time Eilene Ghazi Time): 09/21/2020 4:36:33 PM attempted to reach office backline and it disconnected User: Daphene Calamity, RN Date/Time (Eastern Time): 09/21/2020 4:37:56 PM tried to reach backline and it rang and then went to busy signal User: Daphene Calamity, RN Date/Time (Eastern Time): 09/21/2020 5:07:08 PM told caller that the nurse was unable to get through to the office. Reinforced disposition. She says she refuses ER but will go to [facility] Referrals [facility]

## 2020-09-21 NOTE — ED Notes (Signed)
Reports having cervical fusion 1.5 wks ago, pt contracted Covid.  Pt states this afternoon started with polyuria and bladder pressure; states had Foley cath during surgery. Unk if fevers.

## 2020-09-22 NOTE — Telephone Encounter (Signed)
Noted, patient evaluated at [facility] on 09/21/20

## 2020-09-24 LAB — URINE CULTURE: Culture: >=100000 — AB

## 2020-10-25 ENCOUNTER — Other Ambulatory Visit: Payer: Self-pay

## 2020-10-25 ENCOUNTER — Ambulatory Visit (INDEPENDENT_AMBULATORY_CARE_PROVIDER_SITE_OTHER): Payer: Medicare Other | Admitting: Primary Care

## 2020-10-25 ENCOUNTER — Encounter: Payer: Self-pay | Admitting: Primary Care

## 2020-10-25 VITALS — BP 122/62 | HR 86 | Temp 98.6°F | Ht 65.0 in | Wt 143.0 lb

## 2020-10-25 DIAGNOSIS — L570 Actinic keratosis: Secondary | ICD-10-CM | POA: Insufficient documentation

## 2020-10-25 DIAGNOSIS — Z23 Encounter for immunization: Secondary | ICD-10-CM | POA: Diagnosis not present

## 2020-10-25 DIAGNOSIS — E7801 Familial hypercholesterolemia: Secondary | ICD-10-CM

## 2020-10-25 DIAGNOSIS — E538 Deficiency of other specified B group vitamins: Secondary | ICD-10-CM | POA: Insufficient documentation

## 2020-10-25 DIAGNOSIS — M542 Cervicalgia: Secondary | ICD-10-CM | POA: Diagnosis not present

## 2020-10-25 DIAGNOSIS — J3089 Other allergic rhinitis: Secondary | ICD-10-CM

## 2020-10-25 LAB — LIPID PANEL
Cholesterol: 193 mg/dL (ref 0–200)
HDL: 62.4 mg/dL (ref >39.00)
LDL Cholesterol: 102 mg/dL — ABNORMAL HIGH (ref 0–99)
NonHDL: 130.6
Total CHOL/HDL Ratio: 3
Triglycerides: 145 mg/dL (ref 0.0–149.0)
VLDL: 29 mg/dL (ref 0.0–40.0)

## 2020-10-25 LAB — VITAMIN B12: Vitamin B-12: 929 pg/mL — ABNORMAL HIGH (ref 211–911)

## 2020-10-25 NOTE — Patient Instructions (Addendum)
Stop by the lab prior to leaving today. I will notify you of your results once received.   Nasal Congestion/Ear Pressure/Sinus Pressure: Try using Flonase (fluticasone) nasal spray. Instill 1 spray in each nostril twice daily.   Continue Zyrtec 10 mg daily for drainage.  It was a pleasure to see you today!     Influenza (Flu) Vaccine (Inactivated or Recombinant): What You Need to Know 1. Why get vaccinated? Influenza vaccine can prevent influenza (flu). Flu is a contagious disease that spreads around the [place] every year, usually between October and May. Anyone can get the flu, but it is more dangerous for some people. Infants and young children, people 100 years and older, pregnant people, and people with certain health conditions or a weakened immune system are at greatest risk of flu complications. Pneumonia, bronchitis, sinus infections, and ear infections are examples of flu-related complications. If you have a medical condition, such as heart disease, cancer, or diabetes, flu can make it worse. Flu can cause fever and chills, sore throat, muscle aches, fatigue, cough, headache, and runny or stuffy nose. Some people may have vomiting and diarrhea, though this is more common in children than adults. In an average year, thousands of people in the [place] die from flu, and many more are hospitalized. Flu vaccine prevents millions of illnesses and flu-related visits to the doctor each year. 2. Influenza vaccines CDC recommends everyone 6 months and older get vaccinated every flu season. Children 6 months through 69 years of age may need 2 doses during a single flu season. Everyone else needs only 1 dose each flu season. It takes about 2 weeks for protection to develop after vaccination. There are many flu viruses, and they are always changing. Each year a new flu vaccine is made to protect against the influenza viruses believed to be likely to cause disease in the upcoming flu  season. Even when the vaccine doesn't exactly match these viruses, it may still provide some protection. Influenza vaccine does not cause flu. Influenza vaccine may be given at the same time as other vaccines. 3. Talk with your health care provider Tell your vaccination provider if the person getting the vaccine: Has had an allergic reaction after a previous dose of influenza vaccine, or has any severe, life-threatening allergies Has ever had Guillain-Barr Syndrome (also called "GBS") In some cases, your health care provider may decide to postpone influenza vaccination until a future visit. Influenza vaccine can be administered at any time during pregnancy. People who are or will be pregnant during influenza season should receive inactivated influenza vaccine. People with minor illnesses, such as a cold, may be vaccinated. People who are moderately or severely ill should usually wait until they recover before getting influenza vaccine. Your health care provider can give you more information. 4. Risks of a vaccine reaction Soreness, redness, and swelling where the shot is given, fever, muscle aches, and headache can happen after influenza vaccination. There may be a very small increased risk of Guillain-Barr Syndrome (GBS) after inactivated influenza vaccine (the flu shot). Young children who get the flu shot along with pneumococcal vaccine (PCV13) and/or DTaP vaccine at the same time might be slightly more likely to have a seizure caused by fever. Tell your health care provider if a child who is getting flu vaccine has ever had a seizure. People sometimes faint after medical procedures, including vaccination. Tell your provider if you feel dizzy or have vision changes or ringing in the ears. As with  any medicine, there is a very remote chance of a vaccine causing a severe allergic reaction, other serious injury, or death. 5. What if there is a serious problem? An allergic reaction could occur  after the vaccinated person leaves the clinic. If you see signs of a severe allergic reaction (hives, swelling of the face and throat, difficulty breathing, a fast heartbeat, dizziness, or weakness), call 9-1-1 and get the person to the nearest hospital. For other signs that concern you, call your health care provider. Adverse reactions should be reported to the Vaccine Adverse Event Reporting System (VAERS). Your health care provider will usually file this report, or you can do it yourself. Visit the VAERS website at www.vaers.SamedayNews.es or call 610-274-4887. VAERS is only for reporting reactions, and VAERS staff members do not give medical advice. 6. The National Vaccine Injury Compensation Program The [employer] Vaccine Injury Compensation Program (VICP) is a federal program that was created to compensate people who may have been injured by certain vaccines. Claims regarding alleged injury or death due to vaccination have a time limit for filing, which may be as short as two years. Visit the VICP website at GoldCloset.com.ee or call 475-569-0906 to learn about the program and about filing a claim. 7. How can I learn more? Ask your health care provider. Call your local or state health department. Visit the website of the Food and Drug Administration (FDA) for vaccine package inserts and additional information at TraderRating.uy. Contact the Centers for Disease Control and Prevention (CDC): Call (671)172-5376 (1-800-CDC-INFO) or Visit CDC's website at https://gibson.com/. Vaccine Information Statement Inactivated Influenza Vaccine (10/01/2019) This information is not intended to replace advice given to you by your health care provider. Make sure you discuss any questions you have with your health care provider. Document Revised: 11/18/2019 Document Reviewed: 11/18/2019 Elsevier Patient Education  2022 [employer].

## 2020-10-25 NOTE — Progress Notes (Signed)
Subjective:    Patient ID: Lauren Howard, female    DOB: February 24, 1956, 65 y.o.   MRN: [id number]  HPI  Lauren Howard is a very pleasant 65 y.o. female with a history of anxiety and stress, cervical radiculopathy who presents today to discuss recent neck surgery and general follow up.   She has reduced the amount of vitamin B12 that she is taking weekly. Due for repeat labs today.  She underwent  C4-7 fusion per Dr. Rigoberto Noel on 09/12/20 due to cervical radiculopathy and cervicalgia secondary to mechanical instability. Follow up visit in two days ago per Dr. Rigoberto Noel who released her. She is doing much better post operatively.    Since her surgery she's had an increased amount of mucus and left ear pain and fullness. These symptoms are somewhat better. She recently started Zyrtec.   She would like for [place] to evaluate a spot on her left flank. Chronic for years, has been itchy and irritative for the last 6 weeks. Follows with [facility], has had several keratotic lesions frozen. Denies cancerous lesions.   She contracted Covid-19 infection just after her neck surgery in July 2022. She continues to notice a lingering cough that has improved.   She resumed rosuvastatin 10 mg several months ago. She was trying to work on natural methods for lowering cholesterol but wasn't successful. Denies myalgias.   Review of Systems  HENT:  Positive for postnasal drip.   Respiratory:  Positive for cough. Negative for shortness of breath.   Cardiovascular:  Negative for chest pain.  Musculoskeletal:  Positive for neck pain.  Skin:        Skin lesion        Past Medical History:  Diagnosis Date   Allergy    Anal fissure    Arthritis    Carpal tunnel syndrome    Chickenpox    Ear pain    Frequent headaches    Genital warts    Hypercholesterolemia    Hyperlipidemia    Kidney stones    Neck pain on left side 04/28/2020   Obesity     Social History   Socioeconomic History    Marital status: Married    Spouse name: Not on file   Number of children: 2   Years of education: Not on file   Highest education level: Not on file  Occupational History   Occupation: retired  Tobacco Use   Smoking status: Never   Smokeless tobacco: Never  Vaping Use   Vaping Use: Never used  Substance and Sexual Activity   Alcohol use: Not Currently    Comment: occasionally   Drug use: No   Sexual activity: Not on file  Other Topics Concern   Not on file  Social History Narrative   Married.   Social Determinants of Health   Financial Resource Strain: Not on file  Food Insecurity: Not on file  Transportation Needs: Not on file  Physical Activity: Not on file  Stress: Not on file  Social Connections: Not on file  Intimate Partner Violence: Not on file    Past Surgical History:  Procedure Laterality Date   CERVICAL FUSION     July 2022 @ [place]     HAND SURGERY Left 2018    Family History  Problem Relation Age of Onset   Hypertension Mother    Diabetes Mother    Depression Mother    Arthritis Mother  Dementia Mother    Skin cancer Father    Heart disease Father    Hypertension Father    Hyperlipidemia Father    Dementia Father    Prostate cancer Father    Colon cancer Neg Hx    Esophageal cancer Neg Hx    Rectal cancer Neg Hx    Stomach cancer Neg Hx     Allergies  Allergen Reactions   Bee Venom Anaphylaxis   Penicillins Rash and Other (See Comments)    Rash, swelling Other reaction(s): Other (See Comments) Rash, swelling  Rash and swelling   Erythromycin Base Other (See Comments) and Nausea Only   Erythromycin Nausea Only    rash rash nausea   Other Rash    rash rash Triple ani-tbiotic ointment    Current Outpatient Medications on File Prior to Visit  Medication Sig Dispense Refill   Ascorbic Acid (VITAMIN C) 1000 MG tablet Take 1,000 mg by mouth daily. With rose hips     Cholecalciferol  (VITAMIN D3) 50 MCG (2000 UT) TABS Take 1 tablet by mouth daily.     Coenzyme Q10 (COQ10) 200 MG CAPS Take 1 capsule by mouth daily.     Cyanocobalamin (B-12) 3000 MCG CAPS Take 1 capsule by mouth daily.     dicyclomine (BENTYL) 10 MG capsule TAKE 1 CAPSULE (10 MG TOTAL) BY MOUTH 4 (FOUR) TIMES DAILY - BEFORE MEALS AND AT BEDTIME. 90 capsule 0   EPINEPHrine (EPIPEN 2-PAK) 0.3 mg/0.3 mL IJ SOAJ injection Inject 0.3 mLs (0.3 mg total) into the muscle as needed for anaphylaxis. 1 each 0   Magnesium 100 MG CAPS Take by mouth.     nitrofurantoin, macrocrystal-monohydrate, (MACROBID) 100 MG capsule Take 1 capsule (100 mg total) by mouth 2 (two) times daily. 10 capsule 0   Omega-3 Fatty Acids (FISH OIL) 1200 MG CAPS      omeprazole (PRILOSEC) 20 MG capsule TAKE 1 CAPSULE (20 MG TOTAL) BY MOUTH 2 (TWO) TIMES DAILY BEFORE A MEAL. 180 capsule 0   OVER THE COUNTER MEDICATION GOLI Ashwa gummies- 2-3 a day at bedtime to help sleep     OVER THE COUNTER MEDICATION Gut connect 365- mix with water daily     rosuvastatin (CRESTOR) 10 MG tablet Take 1 tablet (10 mg total) by mouth daily. For cholesterol. 90 tablet 3   HYDROCODONE-ACETAMINOPHEN PO Take by mouth. (Patient not taking: Reported on 10/25/2020)     Menthol, Topical Analgesic, (BIOFREEZE ROLL-ON) 4 % GEL Biofreeze (menthol) 4 % topical gel  Apply 1 application 4 times a day by topical route as needed for 30 days. (Patient not taking: Reported on 10/25/2020)     methocarbamol (ROBAXIN) 750 MG tablet Take 750 mg by mouth 4 (four) times daily. (Patient not taking: Reported on 10/25/2020)     Current Facility-Administered Medications on File Prior to Visit  Medication Dose Route Frequency Provider Last Rate Last Admin   0.9 %  sodium chloride infusion  500 mL Intravenous Once Cirigliano, Vito V, DO        BP 122/62   Pulse 86   Temp 98.6 F (37 C) (Temporal)   Ht '5\' 5"'$  (1.651 m)   Wt 143 lb (64.9 kg)   SpO2 98%   BMI 23.80 kg/m  Objective:   Physical  Exam Cardiovascular:     Rate and Rhythm: Normal rate and regular rhythm.  Pulmonary:     Effort: Pulmonary effort is normal.     Breath sounds: Normal breath  sounds.  Musculoskeletal:     Cervical back: Neck supple.  Skin:    General: Skin is warm and dry.     Comments: 0.25 cm  raised, flesh colored lesion, mild scaling to left flank.non tender.  Psychiatric:        Mood and Affect: Mood normal.          Assessment & Plan:      This visit occurred during the SARS-CoV-2 public health emergency.  Safety protocols were in place, including screening questions prior to the visit, additional usage of staff PPE, and extensive cleaning of exam room while observing appropriate contact time as indicated for disinfecting solutions.

## 2020-10-25 NOTE — Assessment & Plan Note (Signed)
Resumed rosuvastatin 10 mg several months ago. Repeat lipid panel pending.

## 2020-10-25 NOTE — Assessment & Plan Note (Signed)
Repeat B12 level pending. She is taking less overall.

## 2020-10-25 NOTE — Assessment & Plan Note (Signed)
Chronic, has had several frozen. Lesion today appears similar.  Patient will see her dermatologist soon.

## 2020-10-25 NOTE — Assessment & Plan Note (Signed)
Suspect cough and drainage are secondary.  She recently resumed Zyrtec and has seen improvement. Continue same. Discussed use of Flonase if needed.

## 2020-10-25 NOTE — Assessment & Plan Note (Signed)
S/P fusion from July 2022, recent office notes reviewed from two days ago. Patient has been released.  She seems to be doing much better and is pleased with her progression.

## 2020-11-16 ENCOUNTER — Other Ambulatory Visit: Payer: Self-pay | Admitting: Internal Medicine

## 2020-11-16 ENCOUNTER — Other Ambulatory Visit: Payer: Self-pay | Admitting: Primary Care

## 2020-11-16 DIAGNOSIS — Z1231 Encounter for screening mammogram for malignant neoplasm of breast: Secondary | ICD-10-CM

## 2020-12-20 ENCOUNTER — Other Ambulatory Visit: Payer: Self-pay

## 2020-12-20 ENCOUNTER — Ambulatory Visit
Admission: RE | Admit: 2020-12-20 | Discharge: 2020-12-20 | Disposition: A | Discharge status: Home / Self Care | Payer: Medicare Other | Source: Ambulatory Visit | Attending: Primary Care | Admitting: Primary Care

## 2020-12-20 DIAGNOSIS — Z1231 Encounter for screening mammogram for malignant neoplasm of breast: Secondary | ICD-10-CM

## 2021-01-30 ENCOUNTER — Other Ambulatory Visit: Payer: Self-pay | Admitting: Nurse Practitioner

## 2021-02-13 ENCOUNTER — Telehealth: Payer: Self-pay | Admitting: Primary Care

## 2021-02-13 NOTE — Telephone Encounter (Signed)
[facility] has called stated they will be faxing over a request for the patient.   P: [phone] F: [phone]

## 2021-03-10 ENCOUNTER — Other Ambulatory Visit: Payer: Self-pay | Admitting: Nurse Practitioner

## 2021-06-12 ENCOUNTER — Ambulatory Visit (INDEPENDENT_AMBULATORY_CARE_PROVIDER_SITE_OTHER): Payer: Medicare Other | Admitting: Primary Care

## 2021-06-12 ENCOUNTER — Encounter: Payer: Self-pay | Admitting: Primary Care

## 2021-06-12 VITALS — BP 124/60 | HR 62 | Temp 97.8°F | Ht 65.0 in | Wt 147.0 lb

## 2021-06-12 DIAGNOSIS — R32 Unspecified urinary incontinence: Secondary | ICD-10-CM

## 2021-06-12 DIAGNOSIS — G8929 Other chronic pain: Secondary | ICD-10-CM

## 2021-06-12 DIAGNOSIS — Z23 Encounter for immunization: Secondary | ICD-10-CM

## 2021-06-12 DIAGNOSIS — E538 Deficiency of other specified B group vitamins: Secondary | ICD-10-CM | POA: Diagnosis not present

## 2021-06-12 DIAGNOSIS — E7801 Familial hypercholesterolemia: Secondary | ICD-10-CM

## 2021-06-12 DIAGNOSIS — M25511 Pain in right shoulder: Secondary | ICD-10-CM

## 2021-06-12 DIAGNOSIS — K580 Irritable bowel syndrome with diarrhea: Secondary | ICD-10-CM | POA: Diagnosis not present

## 2021-06-12 LAB — COMPREHENSIVE METABOLIC PANEL
ALT: 17 U/L (ref 0–35)
AST: 17 U/L (ref 0–37)
Albumin: 4.2 g/dL (ref 3.5–5.2)
Alkaline Phosphatase: 38 U/L — ABNORMAL LOW (ref 39–117)
BUN: 12 mg/dL (ref 6–23)
CO2: 31 mEq/L (ref 19–32)
Calcium: 9.2 mg/dL (ref 8.4–10.5)
Chloride: 105 mEq/L (ref 96–112)
Creatinine, Ser: 0.56 mg/dL (ref 0.40–1.20)
GFR: 94.89 mL/min (ref >60.00)
Glucose, Bld: 97 mg/dL (ref 70–99)
Potassium: 4.2 mEq/L (ref 3.5–5.1)
Sodium: 140 mEq/L (ref 135–145)
Total Bilirubin: 0.3 mg/dL (ref 0.2–1.2)
Total Protein: 7.2 g/dL (ref 6.0–8.3)

## 2021-06-12 LAB — LIPID PANEL
Cholesterol: 165 mg/dL (ref 0–200)
HDL: 62.2 mg/dL (ref >39.00)
LDL Cholesterol: 88 mg/dL (ref 0–99)
NonHDL: 102.84
Total CHOL/HDL Ratio: 3
Triglycerides: 76 mg/dL (ref 0.0–149.0)
VLDL: 15.2 mg/dL (ref 0.0–40.0)

## 2021-06-12 LAB — CBC
HCT: 38.6 % (ref 36.0–46.0)
Hemoglobin: 13 g/dL (ref 12.0–15.0)
MCHC: 33.7 g/dL (ref 30.0–36.0)
MCV: 88.7 fl (ref 78.0–100.0)
Platelets: 283 10*3/uL (ref 150.0–400.0)
RBC: 4.35 Mil/uL (ref 3.87–5.11)
RDW: 13.3 % (ref 11.5–15.5)
WBC: 5.7 10*3/uL (ref 4.0–10.5)

## 2021-06-12 LAB — VITAMIN B12: Vitamin B-12: 1018 pg/mL — ABNORMAL HIGH (ref 211–911)

## 2021-06-12 NOTE — Assessment & Plan Note (Signed)
Repeat vitamin B12 level pending. 

## 2021-06-12 NOTE — Assessment & Plan Note (Signed)
Stable.  Following with GI.  Office notes from February 2023 reviewed through care everywhere. ? ?Continue dicyclomine 20 mg as needed, omeprazole 20 mg daily. ?

## 2021-06-12 NOTE — Addendum Note (Signed)
Addended by: Francella Solian on: 06/12/2021 10:18 AM ? ? Modules accepted: Orders ? ?

## 2021-06-12 NOTE — Assessment & Plan Note (Addendum)
Continue rosuvastatin 10 mg daily. ?Repeat lipid panel pending. ?

## 2021-06-12 NOTE — Assessment & Plan Note (Signed)
Overall stable. ? ?Discussed treatment options which include pelvic floor physical therapy versus medication.  She will think about PT and update if she decides to proceed. ? ?

## 2021-06-12 NOTE — Patient Instructions (Signed)
Stop by the lab prior to leaving today. I will notify you of your results once received.   It was a pleasure to see you today!  

## 2021-06-12 NOTE — Progress Notes (Signed)
? ?Subjective:  ? ? Patient ID: Lauren Howard, female    DOB: 1955/06/28, 66 y.o.   MRN: [id number] ? ?HPI ? ?Lauren Howard is a very pleasant 66 y.o. female with a history of IBS, dysphagia, hyperlipidemia, fecal incontinence, herpes zoster, urinary incontinence, anxiety, vitamin B12 deficiency who presents today for follow-up of chronic conditions. ? ?-Tetanus: 2021 ?-Influenza: Completed last season ?-Covid-19: 3 vaccines  ?-Shingles: Never completed  ?-Pneumonia: Prevnar 13 in 2022, due for pneumovax today  ? ?Mammogram: Completed in October 2022 ?Colonoscopy: Completed in 2022, due 2032 ?Dexa: Completed in 2022 ? ?1) GERD/Dysphagia/IBS: Following with GI through [facility], last office visit was in February 2023. Currently managed on omeprazole 20 mg daily, dicyclomine 20 mg PRN. Overall doing much better on the increased dose of dicyclomine from 10 mg. She underwent colonoscopy in 2022, polyp resection with pathology results showing benign hyperplastic polyp. Due again for colonoscopy in 2032 ? ?2) Hyperlipidemia: Currently managed on rosuvastatin 10 mg daily. She is due for repeat labs today. Denies myalgias.  ? ?3) Wound: Bit by several fire ants three days ago while working out in the yard. Since then she's noticed 2 sores with redness and itching. She's applied Cortisone and neosporin with some improvement.  ? ?4) Chronic Shoulder Pain: [place] to [place] She has noticed decrease in ROM with pain. [place]. No blunt trauma.  ? ?BP Readings from Last 3 Encounters:  ?06/12/21 124/60  ?10/25/20 122/62  ?09/21/20 (!) 154/79  ? ? ? ?Review of Systems  ?Respiratory:  Negative for shortness of breath.   ?Cardiovascular:  Negative for chest pain.  ?Gastrointestinal:  Positive for diarrhea. Negative for blood in stool and constipation.  ?Genitourinary:   ?     Chronic urinary  incontinence  ?Musculoskeletal:  Positive for arthralgias.  ?Skin:  Positive for wound.  ?Neurological:  Negative for headaches.  ?Psychiatric/Behavioral:  The patient is not nervous/anxious.   ? ?   ? ? ?Past Medical History:  ?Diagnosis Date  ? Allergy   ? Anal fissure   ? Arthritis   ? Carpal tunnel syndrome   ? Chickenpox   ? Ear pain   ? Frequent headaches   ? Genital warts   ? Hypercholesterolemia   ? Hyperlipidemia   ? Kidney stones   ? Neck pain on left side 04/28/2020  ? Obesity   ? ? ?Social History  ? ?Socioeconomic History  ? Marital status: Married  ?  Spouse name: Not on file  ? Number of children: 2  ? Years of education: Not on file  ? Highest education level: Not on file  ?Occupational History  ? Occupation: retired  ?Tobacco Use  ? Smoking status: Never  ? Smokeless tobacco: Never  ?Vaping Use  ? Vaping Use: Never used  ?Substance and Sexual Activity  ? Alcohol use: Not Currently  ?  Comment: occasionally  ? Drug use: No  ? Sexual activity: Not on file  ?Other Topics Concern  ? Not on file  ?Social History Narrative  ? Married.  ? ?Social Determinants of Health  ? ?Financial Resource Strain: Not on file  ?Food Insecurity: Not on file  ?Transportation Needs: Not on file  ?Physical Activity: Not on file  ?Stress: Not on file  ?Social Connections: Not on file  ?Intimate Partner Violence: Not on file  ? ? ?  Past Surgical History:  ?Procedure Laterality Date  ? CERVICAL FUSION    ? July 2022 @ [facility]    ? FACIAL COSMETIC SURGERY    ? HAND SURGERY Left 2018  ? ? ?Family History  ?Problem Relation Age of Onset  ? Hypertension Mother   ? Diabetes Mother   ? Depression Mother   ? Arthritis Mother   ? Dementia Mother   ? Skin cancer Father   ? Heart disease Father   ? Hypertension Father   ? Hyperlipidemia Father   ? Dementia Father   ? Prostate cancer Father   ? Colon cancer Neg Hx   ? Esophageal cancer Neg Hx   ? Rectal cancer Neg Hx   ? Stomach cancer Neg Hx   ? ? ?Allergies  ?Allergen  Reactions  ? Bee Venom Anaphylaxis  ? Penicillins Rash and Other (See Comments)  ?  Rash, swelling ?Other reaction(s): Other (See Comments) ?Rash, swelling ? ?Rash and swelling  ? Erythromycin Base Other (See Comments) and Nausea Only  ? Erythromycin Nausea Only  ?  rash ?rash ?nausea  ? Other Rash  ?  rash ?rash ?Triple ani-tbiotic ointment  ? ? ?Current Outpatient Medications on File Prior to Visit  ?Medication Sig Dispense Refill  ? Ascorbic Acid (VITAMIN C) 1000 MG tablet Take 1,000 mg by mouth daily. With rose hips    ? Cholecalciferol (VITAMIN D3) 50 MCG (2000 UT) TABS Take 1 tablet by mouth daily.    ? Cyanocobalamin (B-12) 3000 MCG CAPS Take 1 capsule by mouth daily.    ? dicyclomine (BENTYL) 10 MG capsule Take 1 capsule (10 mg total) by mouth 4 (four) times daily -  before meals and at bedtime. NO FURTHER REFILLS WITHOUT APPOINTMENT 90 capsule 0  ? EPINEPHrine (EPIPEN 2-PAK) 0.3 mg/0.3 mL IJ SOAJ injection Inject 0.3 mLs (0.3 mg total) into the muscle as needed for anaphylaxis. 1 each 0  ? Magnesium 100 MG CAPS Take by mouth.    ? Menthol, Topical Analgesic, (BIOFREEZE ROLL-ON) 4 % GEL Biofreeze (menthol) 4 % topical gel ? Apply 1 application 4 times a day by topical route as needed for 30 days. (Patient not taking: Reported on 10/25/2020)    ? nitrofurantoin, macrocrystal-monohydrate, (MACROBID) 100 MG capsule Take 1 capsule (100 mg total) by mouth 2 (two) times daily. 10 capsule 0  ? Omega-3 Fatty Acids (FISH OIL) 1200 MG CAPS     ? omeprazole (PRILOSEC) 20 MG capsule TAKE 1 CAPSULE (20 MG TOTAL) BY MOUTH 2 (TWO) TIMES DAILY BEFORE A MEAL. 180 capsule 0  ? OVER THE COUNTER MEDICATION Gut connect 365- mix with water daily    ? rosuvastatin (CRESTOR) 10 MG tablet Take 1 tablet (10 mg total) by mouth daily. For cholesterol. 90 tablet 3  ? ?Current Facility-Administered Medications on File Prior to Visit  ?Medication Dose Route Frequency Provider Last Rate Last Admin  ? 0.9 %  sodium chloride infusion  500 mL  Intravenous Once Cirigliano, Vito V, DO      ? ? ?BP 124/60   Pulse 62   Temp 97.8 ?F (36.6 ?C) (Oral)   Ht '5\' 5"'$  (1.651 m)   Wt 147 lb (66.7 kg)   SpO2 98%   BMI 24.46 kg/m?  ?Objective:  ? Physical Exam ?Cardiovascular:  ?   Rate and Rhythm: Normal rate and regular rhythm.  ?Pulmonary:  ?   Effort: Pulmonary effort is normal.  ?   Breath sounds:  Normal breath sounds.  ?Musculoskeletal:  ?   Right shoulder: No deformity. Decreased range of motion. Normal strength.  ?   Cervical back: Neck supple.  ?   Comments: Decrease in ROM with forward abduction.   ?Skin: ?   General: Skin is warm and dry.  ? ? ? ? ? ?   ?Assessment & Plan:  ? ? ? ? ?This visit occurred during the SARS-CoV-2 public health emergency.  Safety protocols were in place, including screening questions prior to the visit, additional usage of staff PPE, and extensive cleaning of exam room while observing appropriate contact time as indicated for disinfecting solutions.  ?

## 2021-06-12 NOTE — Assessment & Plan Note (Signed)
Symptoms and exam today representative of bursitis. ? ?[place] ?

## 2021-07-25 ENCOUNTER — Other Ambulatory Visit: Payer: Self-pay | Admitting: Primary Care

## 2021-07-25 DIAGNOSIS — E7801 Familial hypercholesterolemia: Secondary | ICD-10-CM

## 2021-07-30 DIAGNOSIS — M7541 Impingement syndrome of right shoulder: Secondary | ICD-10-CM | POA: Insufficient documentation

## 2021-07-30 DIAGNOSIS — G5601 Carpal tunnel syndrome, right upper limb: Secondary | ICD-10-CM | POA: Insufficient documentation

## 2021-09-13 ENCOUNTER — Telehealth: Payer: Self-pay | Admitting: Primary Care

## 2021-09-13 NOTE — Telephone Encounter (Signed)
LVM for pt ro rtn my call to schedule AWV with [facility] call back # [phone]

## 2021-09-18 DIAGNOSIS — A6000 Herpesviral infection of urogenital system, unspecified: Secondary | ICD-10-CM | POA: Insufficient documentation

## 2021-09-18 DIAGNOSIS — L719 Rosacea, unspecified: Secondary | ICD-10-CM | POA: Insufficient documentation

## 2021-09-19 ENCOUNTER — Ambulatory Visit (INDEPENDENT_AMBULATORY_CARE_PROVIDER_SITE_OTHER): Payer: Medicare Other

## 2021-09-19 VITALS — Wt 147.0 lb

## 2021-09-19 DIAGNOSIS — Z0000 Encounter for general adult medical examination without abnormal findings: Secondary | ICD-10-CM

## 2021-09-19 NOTE — Progress Notes (Signed)
Virtual Visit via Telephone Note  I connected with  Lauren Howard on 09/19/21 at  9:00 AM EDT by telephone and verified that I am speaking with the correct person using two identifiers.  Location: Patient: home Provider: [place] Persons participating in the virtual visit: [place]   I discussed the limitations, risks, security and privacy concerns of performing an evaluation and management service by telephone and the availability of in person appointments. The patient expressed understanding and agreed to proceed.  Interactive audio and video telecommunications were attempted between this nurse and patient, however failed, due to patient having technical difficulties OR patient did not have access to video capability.  We continued and completed visit with audio only.  Some vital signs may be absent or patient reported.   Dionisio David, LPN  Subjective:   Lauren Howard is a 66 y.o. female who presents for Medicare Annual (Subsequent) preventive examination.  Review of Systems     Cardiac Risk Factors include: advanced age (>50mn, >>32women)     Objective:    There were no vitals filed for this visit. There is no height or weight on file to calculate BMI.     09/19/2021    9:07 AM 02/16/2020    9:11 PM 02/12/2020    5:30 PM 10/17/2014    1:53 PM 10/13/2014    5:57 PM  Advanced Directives  Does Patient Have a Medical Advance Directive? No No No Yes Yes  Type of Advance Directive    [place]  Does patient want to make changes to medical advance directive?    No - Patient declined   Copy of HFroidin Chart?    No - copy requested   Would patient like information on creating a medical advance directive? No - Patient declined  No - Patient declined      Current Medications (verified) Outpatient Encounter Medications as of 09/19/2021  Medication Sig   Ascorbic Acid  (VITAMIN C) 1000 MG tablet Take 1,000 mg by mouth daily. With rose hips   Cholecalciferol (VITAMIN D3) 50 MCG (2000 UT) TABS Take 1 tablet by mouth daily.   Cyanocobalamin (B-12) 3000 MCG CAPS Take 1 capsule by mouth daily.   dicyclomine (BENTYL) 10 MG capsule Take 1 capsule (10 mg total) by mouth 4 (four) times daily -  before meals and at bedtime. NO FURTHER REFILLS WITHOUT APPOINTMENT   EPINEPHrine (EPIPEN 2-PAK) 0.3 mg/0.3 mL IJ SOAJ injection Inject 0.3 mLs (0.3 mg total) into the muscle as needed for anaphylaxis.   estradiol (ESTRACE VAGINAL) 0.1 MG/GM vaginal cream Insert 1 g twice a week by vaginal route as needed for 150 days.   hydrocortisone (ANUSOL-HC) 2.5 % rectal cream Apply rectally 2 times daily   ipratropium (ATROVENT) 0.06 % nasal spray    Magnesium 100 MG CAPS Take by mouth.   omeprazole (PRILOSEC) 20 MG capsule TAKE 1 CAPSULE (20 MG TOTAL) BY MOUTH 2 (TWO) TIMES DAILY BEFORE A MEAL.   PROCTOSOL HC 2.5 % rectal cream Place rectally 2 (two) times daily.   rosuvastatin (CRESTOR) 10 MG tablet TAKE 1 TABLET BY MOUTH DAILY. FOR CHOLESTEROL   [DISCONTINUED] dicyclomine (BENTYL) 10 MG capsule Take by mouth.   [DISCONTINUED] EPINEPHrine 0.3 mg/0.3 mL IJ SOAJ injection Inject into the muscle.   albuterol (PROVENTIL HFA) 108 (90 Base) MCG/ACT inhaler  (Patient not taking: Reported on 09/19/2021)   benzonatate (TESSALON) 200 MG  capsule  (Patient not taking: Reported on 09/19/2021)   Calcium Carb-Cholecalciferol (OYSTER SHELL CALCIUM W/D) 500-5 MG-MCG TABS Take by mouth. (Patient not taking: Reported on 09/19/2021)   Coenzyme Q10 (COQ-10) 100 MG CAPS CoQ-10 (Patient not taking: Reported on 09/19/2021)   Coenzyme Q10 200 MG capsule Take by mouth. (Patient not taking: Reported on 09/19/2021)   cyclobenzaprine (FLEXERIL) 10 MG tablet TAKE 1/2 TABLET BY MOUTH 3 TIMES DAILY AS NEEDED FOR UP TO 10 DAYS FOR MUSCLE SPASMS (Patient not taking: Reported on 09/19/2021)   Menthol, Topical Analgesic,  (BIOFREEZE ROLL-ON) 4 % GEL  (Patient not taking: Reported on 09/19/2021)   methocarbamol (ROBAXIN) 750 MG tablet Take by mouth. (Patient not taking: Reported on 09/19/2021)   methylPREDNISolone (MEDROL DOSEPAK) 4 MG TBPK tablet Take by mouth. (Patient not taking: Reported on 09/19/2021)   Omega-3 Fatty Acids (FISH OIL) 1200 MG CAPS  (Patient not taking: Reported on 09/19/2021)   [DISCONTINUED] Magnesium Citrate 100 MG CAPS Take 1 capsule by mouth daily.   [DISCONTINUED] Menthol, Topical Analgesic, (BIOFREEZE ROLL-ON) 4 % GEL every 6 (six) hours.   [DISCONTINUED] OVER THE COUNTER MEDICATION Gut connect 365- mix with water daily   Facility-Administered Encounter Medications as of 09/19/2021  Medication   0.9 %  sodium chloride infusion    Allergies (verified) Bee venom, Penicillins, Erythromycin base, and Erythromycin   History: Past Medical History:  Diagnosis Date   Allergy    Anal fissure    Arthritis    Breast pain 05/08/2019   Carpal tunnel syndrome    Chickenpox    Ear pain    Frequent headaches    Genital warts    Hypercholesterolemia    Hyperlipidemia    Kidney stones    Neck pain on left side 04/28/2020   Obesity    Past Surgical History:  Procedure Laterality Date   CERVICAL FUSION     July 2022 @ [place]     HAND SURGERY Left 2018   Family History  Problem Relation Age of Onset   Hypertension Mother    Diabetes Mother    Depression Mother    Arthritis Mother    Dementia Mother    Skin cancer Father    Heart disease Father    Hypertension Father    Hyperlipidemia Father    Dementia Father    Prostate cancer Father    Colon cancer Neg Hx    Esophageal cancer Neg Hx    Rectal cancer Neg Hx    Stomach cancer Neg Hx    Social History   Socioeconomic History   Marital status: Married    Spouse name: Not on file   Number of children: 2   Years of education: Not on file   Highest education level: Not on file   Occupational History   Occupation: retired  Tobacco Use   Smoking status: Never   Smokeless tobacco: Never  Vaping Use   Vaping Use: Never used  Substance and Sexual Activity   Alcohol use: Not Currently    Comment: occasionally   Drug use: No   Sexual activity: Not on file  Other Topics Concern   Not on file  Social History Narrative   Married.   Social Determinants of Health   Financial Resource Strain: Low Risk  (09/19/2021)   Overall Financial Resource Strain (CARDIA)    Difficulty of Paying Living Expenses: Not hard at all  Food Insecurity: No Food  Insecurity (09/19/2021)   Hunger Vital Sign    Worried About Running Out of Food in the Last Year: Never true    [place] in the Last Year: Never true  Transportation Needs: No Transportation Needs (09/19/2021)   PRAPARE - Hydrologist (Medical): No    Lack of Transportation (Non-Medical): No  Physical Activity: Insufficiently Active (09/19/2021)   Exercise Vital Sign    Days of Exercise per Week: 2 days    Minutes of Exercise per Session: 20 min  Stress: No Stress Concern Present (09/19/2021)   [place]    Feeling of Stress : Only a little  Social Connections: Moderately Integrated (09/19/2021)   Social Connection and Isolation Panel [NHANES]    Frequency of Communication with Friends and Family: Three times a week    Frequency of Social Gatherings with Friends and Family: Once a week    Attends Religious Services: More than 4 times per year    Active Member of [employer] or Organizations: No    Attends Music therapist: Never    Marital Status: Married    Tobacco Counseling Counseling given: Not Answered   Clinical Intake:  Pre-visit preparation completed: Yes  Pain : No/denies pain     Nutritional Risks: None Diabetes: No  How often do you need to have someone help you when you read instructions,  pamphlets, or other written materials from your doctor or pharmacy?: 1 - Never  Diabetic?no     Information entered by :: Kirke Shaggy, LPN   Activities of Daily Living    09/19/2021    9:09 AM 06/12/2021    9:36 AM  In your present state of health, do you have any difficulty performing the following activities:  Hearing? 0 1  Vision? 0 1  Difficulty concentrating or making decisions? 0 1  Walking or climbing stairs? 0 1  Dressing or bathing? 0 1  Doing errands, shopping? 0 1  Preparing Food and eating ? N   Using the Toilet? N   In the past six months, have you accidently leaked urine? N   Do you have problems with loss of bowel control? N   Managing your Medications? N   Managing your Finances? N   Housekeeping or managing your Housekeeping? N     Patient Care Team: Pleas Koch, NP as PCP - General (Internal Medicine) Melina Schools, MD as Consulting Physician (Orthopedic Surgery) Levy Pupa, PA-C as Physician Assistant (Chiropractic Medicine) Jeanette Caprice, MD as Referring Physician (Radiology)  Indicate any recent Medical Services you may have received from other than [facility] providers in the past year (date may be approximate).     Assessment:   This is a routine wellness examination for [place].  Hearing/Vision screen Hearing Screening - Comments:: No aids Vision Screening - Comments:: Wears contacts- [facility]  Dietary issues and exercise activities discussed: Current Exercise Habits: Home exercise routine, Type of exercise: walking, Time (Minutes): 30, Frequency (Times/Week): 3, Weekly Exercise (Minutes/Week): 90, Intensity: Mild   Goals Addressed             This Visit's Progress    DIET - EAT MORE FRUITS AND VEGETABLES         Depression Screen    09/19/2021    9:04 AM 06/12/2021    9:35 AM 05/16/2020   11:17 AM  PHQ 2/9 Scores  PHQ - 2 Score 0 0  0  PHQ- 9 Score 0 0 4    Fall Risk    09/19/2021    9:08 AM 06/12/2021    9:36 AM  05/16/2020   11:48 AM  Fall Risk   Falls in the past year? 0 0 0  Number falls in past yr: 0 0 0  Injury with Fall? 0 0 0  Risk for fall due to : No Fall Risks    Follow up Falls evaluation completed      FALL RISK PREVENTION PERTAINING TO THE HOME:  Any stairs in or around the home? No  If so, are there any without handrails? No  Home free of loose throw rugs in walkways, pet beds, electrical cords, etc? Yes  Adequate lighting in your home to reduce risk of falls? Yes   ASSISTIVE DEVICES UTILIZED TO PREVENT FALLS:  Life alert? No  Use of a cane, walker or w/c? No  Grab bars in the bathroom? No  Shower chair or bench in shower? No  Elevated toilet seat or a handicapped toilet? Yes   Cognitive Function:        09/19/2021    9:10 AM  6CIT Screen  What Year? 0 points  What month? 0 points  What time? 0 points  Count back from 20 0 points  Months in reverse 0 points  Repeat phrase 0 points  Total Score 0 points    Immunizations Immunization History  Administered Date(s) Administered   Fluad Quad(high Dose 65+) 10/25/2020   Influenza,inj,Quad PF,6+ Mos 11/27/2017   Influenza-Unspecified 12/12/2016, 11/27/2017, 11/26/2018   PFIZER Comirnaty(Gray Top)Covid-19 Tri-Sucrose Vaccine 05/07/2019, 05/28/2019, 01/01/2020   PFIZER(Purple Top)SARS-COV-2 Vaccination 05/07/2019   Pneumococcal Conjugate-13 05/16/2020   Pneumococcal Polysaccharide-23 06/12/2021   Tdap 12/08/2011, 01/06/2020    TDAP status: Up to date  Flu Vaccine status: Up to date  Pneumococcal vaccine status: Up to date  Covid-19 vaccine status: Completed vaccines  Qualifies for Shingles Vaccine? Yes   Zostavax completed No   Shingrix Completed?: No.    Education has been provided regarding the importance of this vaccine. Patient has been advised to call insurance company to determine out of pocket expense if they have not yet received this vaccine. Advised may also receive vaccine at local pharmacy or  Health Dept. Verbalized acceptance and understanding.  Screening Tests Health Maintenance  Topic Date Due   Zoster Vaccines- Shingrix (1 of 2) Never done   COVID-19 Vaccine (5 - Booster for Pfizer series) 02/26/2020   INFLUENZA VACCINE  09/25/2021   MAMMOGRAM  12/21/2022   TETANUS/TDAP  01/05/2030   COLONOSCOPY (Pts 45-1yr Insurance coverage will need to be confirmed)  03/03/2030   Pneumonia Vaccine 66 Years old  Completed   DEXA SCAN  Completed   Hepatitis C Screening  Completed   HPV VACCINES  Aged Out    Health Maintenance  Health Maintenance Due  Topic Date Due   Zoster Vaccines- Shingrix (1 of 2) Never done   COVID-19 Vaccine (5 - Booster for Pfizer series) 02/26/2020    Colorectal cancer screening: Type of screening: Colonoscopy. Completed 03/03/20. Repeat every 10 years  Mammogram status: Completed 12/20/20. Repeat every year  Bone Density status: Completed 06/22/20. Results reflect: Bone density results: OSTEOPENIA. Repeat every 5 years.  Lung Cancer Screening: (Low Dose CT Chest recommended if Age 67-80years, 30 pack-year currently smoking OR have quit w/in 15years.) does not qualify.    Additional Screening:  Hepatitis C Screening: does qualify; Completed 04/09/18  Vision Screening:  Recommended annual ophthalmology exams for early detection of glaucoma and other disorders of the eye. Is the patient up to date with their annual eye exam?  Yes  Who is the provider or what is the name of the office in which the patient attends annual eye exams? [facility] If pt is not established with a provider, would they like to be referred to a provider to establish care? No .   Dental Screening: Recommended annual dental exams for proper oral hygiene  Community Resource Referral / Chronic Care Management: CRR required this visit?  No   CCM required this visit?  No      Plan:     I have personally reviewed and noted the following in the patient's chart:   Medical  and social history Use of alcohol, tobacco or illicit drugs  Current medications and supplements including opioid prescriptions.  Functional ability and status Nutritional status Physical activity Advanced directives List of other physicians Hospitalizations, surgeries, and ER visits in previous 12 months Vitals Screenings to include cognitive, depression, and falls Referrals and appointments  In addition, I have reviewed and discussed with patient certain preventive protocols, quality metrics, and best practice recommendations. A written personalized care plan for preventive services as well as general preventive health recommendations were provided to patient.     Dionisio David, LPN   2/29/7989   Nurse Notes: none

## 2021-09-19 NOTE — Patient Instructions (Signed)
Lauren Howard , Thank you for taking time to come for your Medicare Wellness Visit. I appreciate your ongoing commitment to your health goals. Please review the following plan we discussed and let me know if I can assist you in the future.   Screening recommendations/referrals: Colonoscopy: 03/03/20 Mammogram: 12/20/20 Bone Density: 06/22/20 Recommended yearly ophthalmology/optometry visit for glaucoma screening and checkup Recommended yearly dental visit for hygiene and checkup  Vaccinations: Influenza vaccine: 10/25/20 Pneumococcal vaccine: 06/12/21 Tdap vaccine: 11//21 Shingles vaccine: n/d   Covid-19:05/07/19, 05/28/19, 01/01/20  Advanced directives: no  Conditions/risks identified: none  Next appointment: Follow up in one year for your annual wellness visit 09/23/22 @ 8:45 am by phone   Preventive Care 65 Years and Older, Female Preventive care refers to lifestyle choices and visits with your health care provider that can promote health and wellness. What does preventive care include? A yearly physical exam. This is also called an annual well check. Dental exams once or twice a year. Routine eye exams. Ask your health care provider how often you should have your eyes checked. Personal lifestyle choices, including: Daily care of your teeth and gums. Regular physical activity. Eating a healthy diet. Avoiding tobacco and drug use. Limiting alcohol use. Practicing safe sex. Taking low-dose aspirin every day. Taking vitamin and mineral supplements as recommended by your health care provider. What happens during an annual well check? The services and screenings done by your health care provider during your annual well check will depend on your age, overall health, lifestyle risk factors, and family history of disease. Counseling  Your health care provider may ask you questions about your: Alcohol use. Tobacco use. Drug use. Emotional well-being. Home and relationship  well-being. Sexual activity. Eating habits. History of falls. Memory and ability to understand (cognition). Work and work Statistician. Reproductive health. Screening  You may have the following tests or measurements: Height, weight, and BMI. Blood pressure. Lipid and cholesterol levels. These may be checked every 5 years, or more frequently if you are over 61 years old. Skin check. Lung cancer screening. You may have this screening every year starting at age 67 if you have a 30-pack-year history of smoking and currently smoke or have quit within the past 15 years. Fecal occult blood test (FOBT) of the stool. You may have this test every year starting at age 27. Flexible sigmoidoscopy or colonoscopy. You may have a sigmoidoscopy every 5 years or a colonoscopy every 10 years starting at age 23. Hepatitis C blood test. Hepatitis B blood test. Sexually transmitted disease (STD) testing. Diabetes screening. This is done by checking your blood sugar (glucose) after you have not eaten for a while (fasting). You may have this done every 1-3 years. Bone density scan. This is done to screen for osteoporosis. You may have this done starting at age 39. Mammogram. This may be done every 1-2 years. Talk to your health care provider about how often you should have regular mammograms. Talk with your health care provider about your test results, treatment options, and if necessary, the need for more tests. Vaccines  Your health care provider may recommend certain vaccines, such as: Influenza vaccine. This is recommended every year. Tetanus, diphtheria, and acellular pertussis (Tdap, Td) vaccine. You may need a Td booster every 10 years. Zoster vaccine. You may need this after age 56. Pneumococcal 13-valent conjugate (PCV13) vaccine. One dose is recommended after age 49. Pneumococcal polysaccharide (PPSV23) vaccine. One dose is recommended after age 57. Talk to your health care  provider about which  screenings and vaccines you need and how often you need them. This information is not intended to replace advice given to you by your health care provider. Make sure you discuss any questions you have with your health care provider. Document Released: 03/10/2015 Document Revised: 11/01/2015 Document Reviewed: 12/13/2014 Elsevier Interactive Patient Education  2017 [place] Prevention in the Home Falls can cause injuries. They can happen to people of all ages. There are many things you can do to make your home safe and to help prevent falls. What can I do on the outside of my home? Regularly fix the edges of walkways and driveways and fix any cracks. Remove anything that might make you trip as you walk through a door, such as a raised step or threshold. Trim any bushes or trees on the path to your home. Use bright outdoor lighting. Clear any walking paths of anything that might make someone trip, such as rocks or tools. Regularly check to see if handrails are loose or broken. Make sure that both sides of any steps have handrails. Any raised decks and porches should have guardrails on the edges. Have any leaves, snow, or ice cleared regularly. Use sand or salt on walking paths during winter. Clean up any spills in your garage right away. This includes oil or grease spills. What can I do in the bathroom? Use night lights. Install grab bars by the toilet and in the tub and shower. Do not use towel bars as grab bars. Use non-skid mats or decals in the tub or shower. If you need to sit down in the shower, use a plastic, non-slip stool. Keep the floor dry. Clean up any water that spills on the floor as soon as it happens. Remove soap buildup in the tub or shower regularly. Attach bath mats securely with double-sided non-slip rug tape. Do not have throw rugs and other things on the floor that can make you trip. What can I do in the bedroom? Use night lights. Make sure that you have a  light by your bed that is easy to reach. Do not use any sheets or blankets that are too big for your bed. They should not hang down onto the floor. Have a firm chair that has side arms. You can use this for support while you get dressed. Do not have throw rugs and other things on the floor that can make you trip. What can I do in the kitchen? Clean up any spills right away. Avoid walking on wet floors. Keep items that you use a lot in easy-to-reach places. If you need to reach something above you, use a strong step stool that has a grab bar. Keep electrical cords out of the way. Do not use floor polish or wax that makes floors slippery. If you must use wax, use non-skid floor wax. Do not have throw rugs and other things on the floor that can make you trip. What can I do with my stairs? Do not leave any items on the stairs. Make sure that there are handrails on both sides of the stairs and use them. Fix handrails that are broken or loose. Make sure that handrails are as long as the stairways. Check any carpeting to make sure that it is firmly attached to the stairs. Fix any carpet that is loose or worn. Avoid having throw rugs at the top or bottom of the stairs. If you do have throw rugs, attach them to the  floor with carpet tape. Make sure that you have a light switch at the top of the stairs and the bottom of the stairs. If you do not have them, ask someone to add them for you. What else can I do to help prevent falls? Wear shoes that: Do not have high heels. Have rubber bottoms. Are comfortable and fit you well. Are closed at the toe. Do not wear sandals. If you use a stepladder: Make sure that it is fully opened. Do not climb a closed stepladder. Make sure that both sides of the stepladder are locked into place. Ask someone to hold it for you, if possible. Clearly mark and make sure that you can see: Any grab bars or handrails. First and last steps. Where the edge of each step  is. Use tools that help you move around (mobility aids) if they are needed. These include: Canes. Walkers. Scooters. Crutches. Turn on the lights when you go into a dark area. Replace any light bulbs as soon as they burn out. Set up your furniture so you have a clear path. Avoid moving your furniture around. If any of your floors are uneven, fix them. If there are any pets around you, be aware of where they are. Review your medicines with your doctor. Some medicines can make you feel dizzy. This can increase your chance of falling. Ask your doctor what other things that you can do to help prevent falls. This information is not intended to replace advice given to you by your health care provider. Make sure you discuss any questions you have with your health care provider. Document Released: 12/08/2008 Document Revised: 07/20/2015 Document Reviewed: 03/18/2014 Elsevier Interactive Patient Education  2017 [employer]. 2

## 2021-12-25 LAB — HM MAMMOGRAPHY

## 2021-12-26 ENCOUNTER — Encounter: Payer: Self-pay | Admitting: Primary Care
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
## 2023-03-10 ENCOUNTER — Telehealth: Payer: Self-pay | Admitting: Primary Care

## 2023-03-10 NOTE — Telephone Encounter (Signed)
 Contacted Calton Golds to schedule their annual wellness visit. Patient declined to schedule AWV at this time. [place], no pcp name provided by patient.   [facility] Care Guide [facility] AWV TEAM Direct Dial: [phone]
